# Patient Record
Sex: Male | Born: 1970
Health system: Southern US, Community
[De-identification: ages and names within clinical notes are randomized; demographics above are authoritative.]

## PROBLEM LIST (undated history)

## (undated) DIAGNOSIS — F419 Anxiety disorder, unspecified: Secondary | ICD-10-CM

## (undated) DIAGNOSIS — J449 Chronic obstructive pulmonary disease, unspecified: Secondary | ICD-10-CM

## (undated) DIAGNOSIS — K219 Gastro-esophageal reflux disease without esophagitis: Secondary | ICD-10-CM

## (undated) DIAGNOSIS — R0789 Other chest pain: Secondary | ICD-10-CM

## (undated) DIAGNOSIS — M545 Low back pain, unspecified: Secondary | ICD-10-CM

## (undated) DIAGNOSIS — R7309 Other abnormal glucose: Secondary | ICD-10-CM

## (undated) DIAGNOSIS — F329 Major depressive disorder, single episode, unspecified: Secondary | ICD-10-CM

## (undated) DIAGNOSIS — F32A Depression, unspecified: Secondary | ICD-10-CM

## (undated) DIAGNOSIS — R635 Abnormal weight gain: Secondary | ICD-10-CM

## (undated) HISTORY — DX: Low back pain: M54.5

## (undated) HISTORY — DX: Gastro-esophageal reflux disease without esophagitis: K21.9

## (undated) HISTORY — DX: Chronic obstructive pulmonary disease, unspecified: J44.9

## (undated) HISTORY — DX: Other abnormal glucose: R73.09

## (undated) HISTORY — DX: Depression, unspecified: F32.A

## (undated) HISTORY — DX: Anxiety disorder, unspecified: F41.9

## (undated) HISTORY — DX: Low back pain, unspecified: M54.50

## (undated) HISTORY — DX: Abnormal weight gain: R63.5

## (undated) HISTORY — DX: Major depressive disorder, single episode, unspecified: F32.9

## (undated) HISTORY — DX: Other chest pain: R07.89

---

## 2002-12-20 ENCOUNTER — Emergency Department (HOSPITAL_COMMUNITY): Admission: EM | Admit: 2002-12-20 | Discharge: 2002-12-21 | Payer: Self-pay | Admitting: Emergency Medicine

## 2003-08-21 ENCOUNTER — Emergency Department (HOSPITAL_COMMUNITY): Admission: EM | Admit: 2003-08-21 | Discharge: 2003-08-21 | Payer: Self-pay | Admitting: Emergency Medicine

## 2003-10-07 ENCOUNTER — Emergency Department (HOSPITAL_COMMUNITY): Admission: EM | Admit: 2003-10-07 | Discharge: 2003-10-07 | Payer: Self-pay | Admitting: Emergency Medicine

## 2003-11-04 ENCOUNTER — Emergency Department (HOSPITAL_COMMUNITY): Admission: EM | Admit: 2003-11-04 | Discharge: 2003-11-04 | Payer: Self-pay | Admitting: *Deleted

## 2003-12-19 ENCOUNTER — Emergency Department (HOSPITAL_COMMUNITY): Admission: EM | Admit: 2003-12-19 | Discharge: 2003-12-19 | Payer: Self-pay | Admitting: Emergency Medicine

## 2004-01-09 ENCOUNTER — Emergency Department (HOSPITAL_COMMUNITY): Admission: EM | Admit: 2004-01-09 | Discharge: 2004-01-09 | Payer: Self-pay | Admitting: Emergency Medicine

## 2004-02-01 ENCOUNTER — Emergency Department (HOSPITAL_COMMUNITY): Admission: EM | Admit: 2004-02-01 | Discharge: 2004-02-02 | Payer: Self-pay | Admitting: Emergency Medicine

## 2004-02-06 ENCOUNTER — Ambulatory Visit: Payer: Self-pay | Admitting: Internal Medicine

## 2004-03-06 ENCOUNTER — Emergency Department (HOSPITAL_COMMUNITY): Admission: EM | Admit: 2004-03-06 | Discharge: 2004-03-06 | Payer: Self-pay | Admitting: Emergency Medicine

## 2004-03-07 ENCOUNTER — Ambulatory Visit: Payer: Self-pay | Admitting: Internal Medicine

## 2004-03-20 ENCOUNTER — Ambulatory Visit: Payer: Self-pay | Admitting: Gastroenterology

## 2004-03-25 ENCOUNTER — Ambulatory Visit: Payer: Self-pay | Admitting: Gastroenterology

## 2004-04-15 ENCOUNTER — Ambulatory Visit: Payer: Self-pay | Admitting: Gastroenterology

## 2004-04-24 ENCOUNTER — Ambulatory Visit: Payer: Self-pay | Admitting: Internal Medicine

## 2004-08-25 ENCOUNTER — Ambulatory Visit: Payer: Self-pay | Admitting: Internal Medicine

## 2004-10-09 ENCOUNTER — Ambulatory Visit: Payer: Self-pay | Admitting: Gastroenterology

## 2004-11-17 ENCOUNTER — Ambulatory Visit: Payer: Self-pay | Admitting: Gastroenterology

## 2005-01-12 ENCOUNTER — Emergency Department (HOSPITAL_COMMUNITY): Admission: EM | Admit: 2005-01-12 | Discharge: 2005-01-12 | Payer: Self-pay | Admitting: Emergency Medicine

## 2005-01-22 ENCOUNTER — Ambulatory Visit: Payer: Self-pay | Admitting: Internal Medicine

## 2005-05-27 ENCOUNTER — Emergency Department (HOSPITAL_COMMUNITY): Admission: EM | Admit: 2005-05-27 | Discharge: 2005-05-27 | Payer: Self-pay | Admitting: Emergency Medicine

## 2005-06-08 ENCOUNTER — Ambulatory Visit: Payer: Self-pay | Admitting: Internal Medicine

## 2005-06-14 IMAGING — CR DG CHEST 2V
2 series · 2 of 2 positions shown · non-contrast
Comparison: none

CLINICAL DATA: Left lower chest pain and shortness of breath. 
 TWO VIEW CHEST ? 01/09/04:
 Comparing 12/19/03.

[view not recorded (1 of 2)]
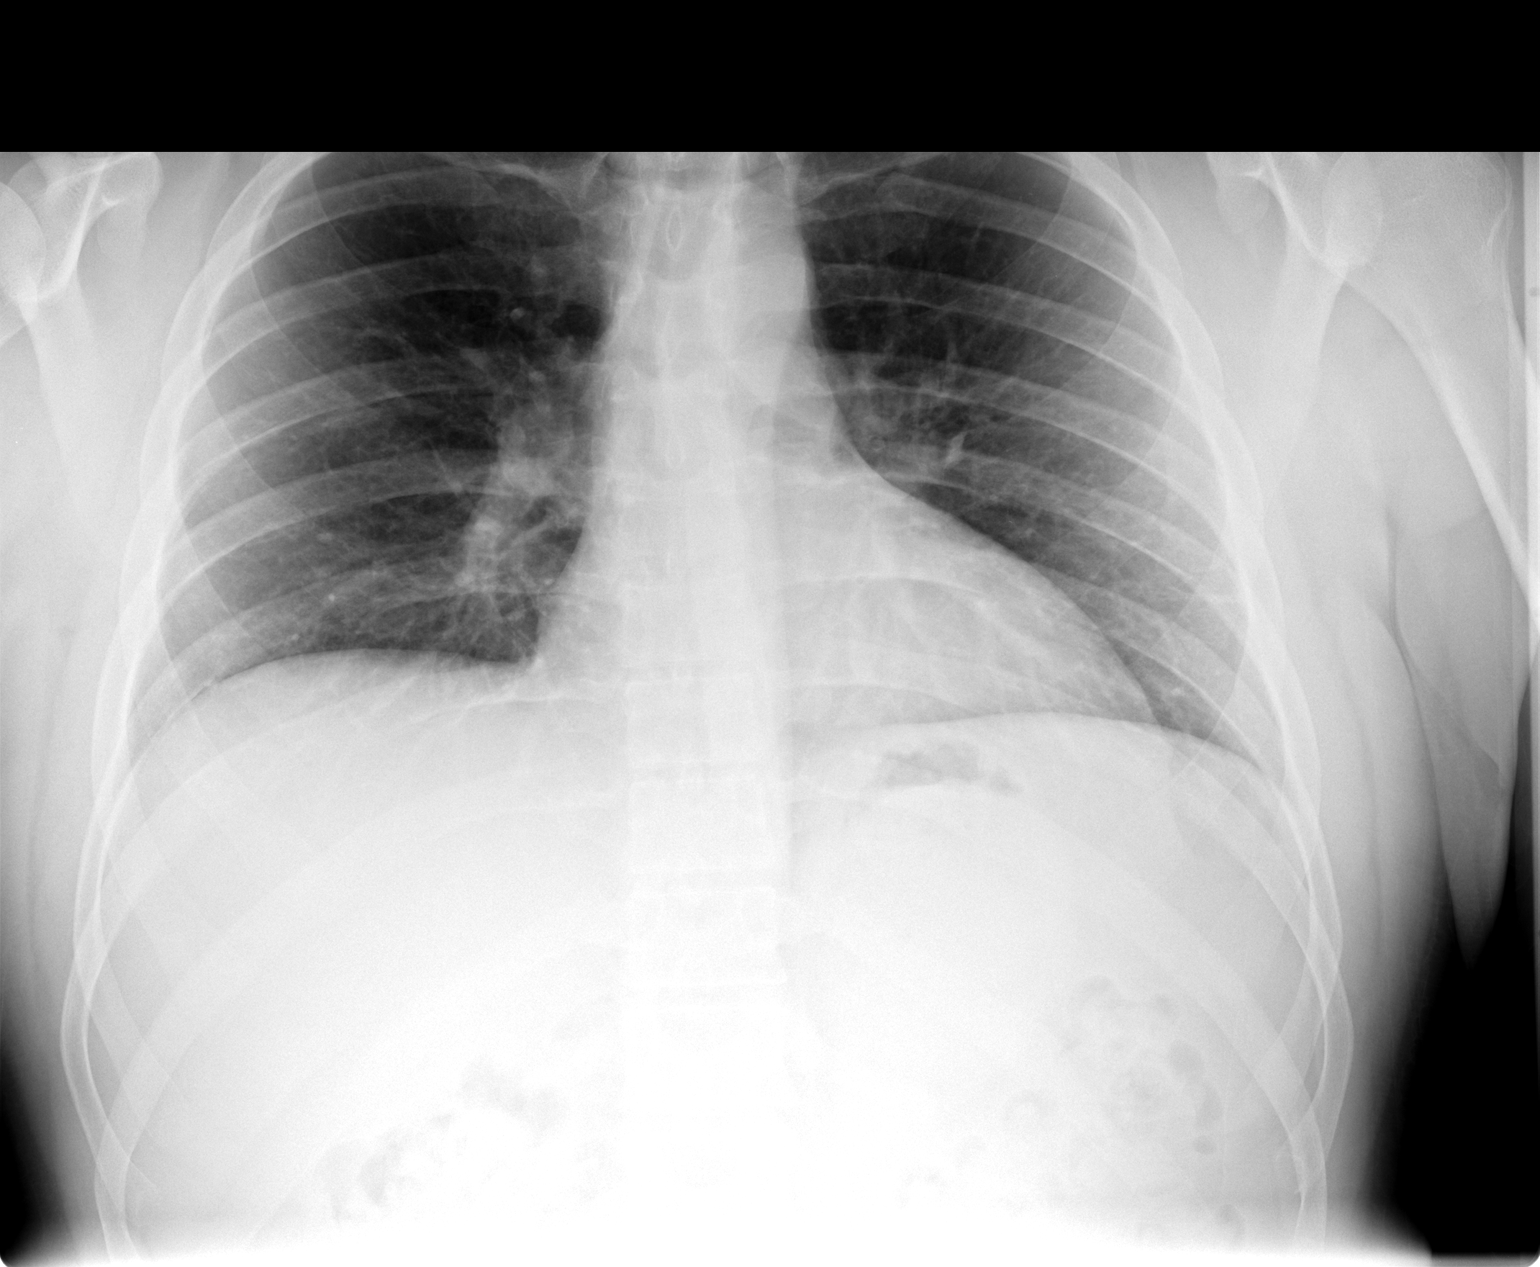

[view not recorded (2 of 2)]
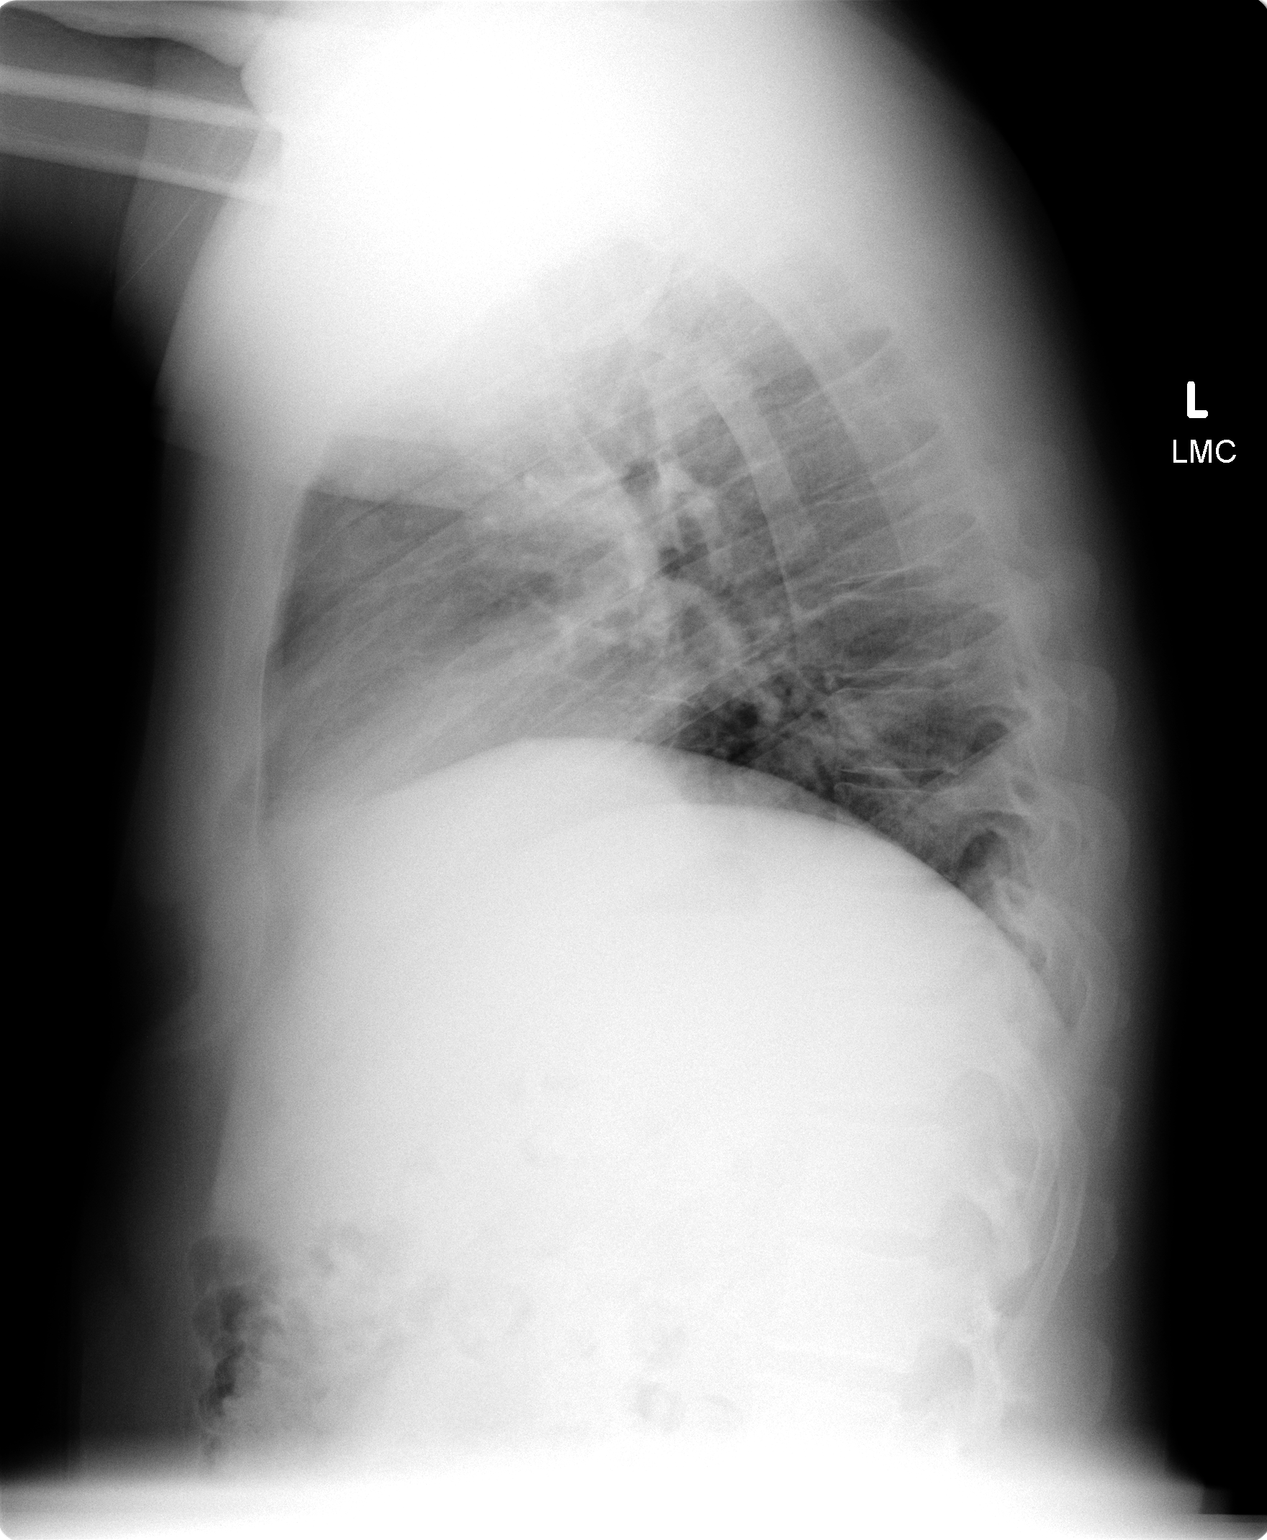

[2 of 2 positions shown; findings below may reference images not displayed]

FINDINGS: Minimal scarring at the left lung base is stable.   Low lung volumes are present.   The lungs appear otherwise clear.   Heart and mediastinum appear unremarkable.
IMPRESSION: 1.  Minimal scarring at the left lung base.

## 2005-06-29 ENCOUNTER — Ambulatory Visit: Payer: Self-pay | Admitting: Internal Medicine

## 2005-09-29 ENCOUNTER — Ambulatory Visit: Payer: Self-pay | Admitting: Internal Medicine

## 2006-01-01 ENCOUNTER — Ambulatory Visit: Payer: Self-pay | Admitting: Internal Medicine

## 2006-06-04 ENCOUNTER — Ambulatory Visit: Payer: Self-pay | Admitting: Internal Medicine

## 2006-06-04 LAB — CONVERTED CEMR LAB
ALT: 56 units/L — ABNORMAL HIGH (ref 0–40)
AST: 30 units/L (ref 0–37)
Albumin: 4 g/dL (ref 3.5–5.2)
Alkaline Phosphatase: 54 units/L (ref 39–117)
BUN: 9 mg/dL (ref 6–23)
Bilirubin, Direct: 0.1 mg/dL (ref 0.0–0.3)
CO2: 29 meq/L (ref 19–32)
Calcium: 9 mg/dL (ref 8.4–10.5)
Chloride: 107 meq/L (ref 96–112)
Creatinine, Ser: 0.9 mg/dL (ref 0.4–1.5)
GFR calc Af Amer: 125 mL/min
GFR calc non Af Amer: 103 mL/min
Glucose, Bld: 105 mg/dL — ABNORMAL HIGH (ref 70–99)
Hgb A1c MFr Bld: 5.9 % (ref 4.6–6.0)
Potassium: 4.3 meq/L (ref 3.5–5.1)
Sodium: 141 meq/L (ref 135–145)
TSH: 1.21 microintl units/mL (ref 0.35–5.50)
Total Bilirubin: 0.7 mg/dL (ref 0.3–1.2)
Total Protein: 7.1 g/dL (ref 6.0–8.3)

## 2006-10-02 DIAGNOSIS — K219 Gastro-esophageal reflux disease without esophagitis: Secondary | ICD-10-CM

## 2006-10-26 ENCOUNTER — Ambulatory Visit: Payer: Self-pay | Admitting: Internal Medicine

## 2007-01-25 ENCOUNTER — Telehealth: Payer: Self-pay | Admitting: Internal Medicine

## 2007-01-27 ENCOUNTER — Telehealth (INDEPENDENT_AMBULATORY_CARE_PROVIDER_SITE_OTHER): Payer: Self-pay | Admitting: *Deleted

## 2007-02-02 ENCOUNTER — Telehealth: Payer: Self-pay | Admitting: Internal Medicine

## 2007-02-07 ENCOUNTER — Ambulatory Visit: Payer: Self-pay | Admitting: Internal Medicine

## 2007-02-07 DIAGNOSIS — F411 Generalized anxiety disorder: Secondary | ICD-10-CM | POA: Insufficient documentation

## 2007-02-07 DIAGNOSIS — J449 Chronic obstructive pulmonary disease, unspecified: Secondary | ICD-10-CM

## 2007-02-07 DIAGNOSIS — F17200 Nicotine dependence, unspecified, uncomplicated: Secondary | ICD-10-CM | POA: Insufficient documentation

## 2007-02-07 DIAGNOSIS — R635 Abnormal weight gain: Secondary | ICD-10-CM | POA: Insufficient documentation

## 2007-02-07 DIAGNOSIS — J4489 Other specified chronic obstructive pulmonary disease: Secondary | ICD-10-CM | POA: Insufficient documentation

## 2007-02-07 DIAGNOSIS — J209 Acute bronchitis, unspecified: Secondary | ICD-10-CM

## 2007-05-19 ENCOUNTER — Encounter: Payer: Self-pay | Admitting: Internal Medicine

## 2007-05-20 ENCOUNTER — Ambulatory Visit: Payer: Self-pay | Admitting: Internal Medicine

## 2007-05-20 DIAGNOSIS — M25569 Pain in unspecified knee: Secondary | ICD-10-CM | POA: Insufficient documentation

## 2007-05-20 DIAGNOSIS — R7309 Other abnormal glucose: Secondary | ICD-10-CM | POA: Insufficient documentation

## 2007-05-30 ENCOUNTER — Telehealth (INDEPENDENT_AMBULATORY_CARE_PROVIDER_SITE_OTHER): Payer: Self-pay | Admitting: *Deleted

## 2007-07-20 ENCOUNTER — Ambulatory Visit: Payer: Self-pay | Admitting: Internal Medicine

## 2007-07-20 DIAGNOSIS — M79609 Pain in unspecified limb: Secondary | ICD-10-CM | POA: Insufficient documentation

## 2007-10-10 ENCOUNTER — Telehealth: Payer: Self-pay | Admitting: Internal Medicine

## 2007-10-31 ENCOUNTER — Telehealth: Payer: Self-pay | Admitting: Internal Medicine

## 2007-11-21 ENCOUNTER — Telehealth: Payer: Self-pay | Admitting: Internal Medicine

## 2007-11-21 ENCOUNTER — Ambulatory Visit: Payer: Self-pay | Admitting: Internal Medicine

## 2008-02-20 ENCOUNTER — Telehealth: Payer: Self-pay | Admitting: Internal Medicine

## 2008-03-15 ENCOUNTER — Telehealth: Payer: Self-pay | Admitting: Internal Medicine

## 2008-04-03 ENCOUNTER — Ambulatory Visit: Payer: Self-pay | Admitting: Internal Medicine

## 2008-04-03 LAB — CONVERTED CEMR LAB: Blood Glucose, Fingerstick: 151

## 2008-04-17 ENCOUNTER — Telehealth: Payer: Self-pay | Admitting: Internal Medicine

## 2008-05-16 ENCOUNTER — Encounter (INDEPENDENT_AMBULATORY_CARE_PROVIDER_SITE_OTHER): Payer: Self-pay | Admitting: *Deleted

## 2008-05-24 ENCOUNTER — Ambulatory Visit: Payer: Self-pay | Admitting: Internal Medicine

## 2008-05-24 DIAGNOSIS — M545 Low back pain: Secondary | ICD-10-CM | POA: Insufficient documentation

## 2008-06-06 ENCOUNTER — Telehealth: Payer: Self-pay | Admitting: Internal Medicine

## 2008-06-18 ENCOUNTER — Encounter: Payer: Self-pay | Admitting: Internal Medicine

## 2008-08-08 ENCOUNTER — Telehealth: Payer: Self-pay | Admitting: Internal Medicine

## 2008-09-04 ENCOUNTER — Telehealth: Payer: Self-pay | Admitting: Internal Medicine

## 2008-09-26 ENCOUNTER — Ambulatory Visit: Payer: Self-pay | Admitting: Internal Medicine

## 2008-09-26 DIAGNOSIS — E669 Obesity, unspecified: Secondary | ICD-10-CM

## 2008-09-26 DIAGNOSIS — R079 Chest pain, unspecified: Secondary | ICD-10-CM | POA: Insufficient documentation

## 2008-11-02 ENCOUNTER — Ambulatory Visit: Payer: Self-pay | Admitting: Internal Medicine

## 2008-12-27 ENCOUNTER — Telehealth: Payer: Self-pay | Admitting: Internal Medicine

## 2009-01-28 ENCOUNTER — Telehealth: Payer: Self-pay | Admitting: Internal Medicine

## 2009-02-15 ENCOUNTER — Ambulatory Visit: Payer: Self-pay | Admitting: Internal Medicine

## 2009-05-06 ENCOUNTER — Telehealth: Payer: Self-pay | Admitting: Internal Medicine

## 2009-05-08 ENCOUNTER — Telehealth: Payer: Self-pay | Admitting: Internal Medicine

## 2009-05-30 ENCOUNTER — Telehealth: Payer: Self-pay | Admitting: Internal Medicine

## 2009-05-30 ENCOUNTER — Telehealth (INDEPENDENT_AMBULATORY_CARE_PROVIDER_SITE_OTHER): Payer: Self-pay | Admitting: *Deleted

## 2009-06-05 ENCOUNTER — Ambulatory Visit: Payer: Self-pay | Admitting: Internal Medicine

## 2009-08-27 ENCOUNTER — Ambulatory Visit: Payer: Self-pay | Admitting: Internal Medicine

## 2009-11-07 ENCOUNTER — Ambulatory Visit: Payer: Self-pay | Admitting: Internal Medicine

## 2009-11-07 DIAGNOSIS — F329 Major depressive disorder, single episode, unspecified: Secondary | ICD-10-CM

## 2009-11-26 ENCOUNTER — Ambulatory Visit: Payer: Self-pay | Admitting: Internal Medicine

## 2009-12-03 ENCOUNTER — Telehealth: Payer: Self-pay | Admitting: Internal Medicine

## 2010-02-03 ENCOUNTER — Telehealth: Payer: Self-pay | Admitting: Internal Medicine

## 2010-02-05 ENCOUNTER — Ambulatory Visit: Payer: Self-pay | Admitting: Internal Medicine

## 2010-02-17 ENCOUNTER — Telehealth: Payer: Self-pay | Admitting: Internal Medicine

## 2010-03-25 ENCOUNTER — Telehealth: Payer: Self-pay | Admitting: Internal Medicine

## 2010-04-01 ENCOUNTER — Telehealth: Payer: Self-pay | Admitting: Internal Medicine

## 2010-04-10 NOTE — Assessment & Plan Note (Signed)
Summary: COLD/NWS   Vital Signs:  Patient profile:   40 year old male Height:      68 inches Weight:      168 pounds BMI:     25.64 Temp:     97.0 degrees F oral Pulse rate:   88 / minute Pulse rhythm:   regular Resp:     16 per minute BP sitting:   128 / 70  (left arm) Cuff size:   regular  Vitals Entered By: Lanier Prude, CMA(AAMA) (November 07, 2009 9:15 AM) CC: runny nose/sneezing Is Patient Diabetic? No Comments pt is not taking Prozac, Alprazolam, Tretinoin, Triamcinolone, or Vimovo   CC:  runny nose/sneezing.  History of Present Illness: The patient presents for a follow up of back pain, anxiety, depression and LBP. C/o URI - cough x 1 wk   Preventive Screening-Counseling & Management  Caffeine-Diet-Exercise     Does Patient Exercise: no  Current Medications (verified): 1)  Prozac 20 Mg  Caps (Fluoxetine Hcl) .... By Mouth Once Daily 2)  Vitamin D3 1000 Unit  Tabs (Cholecalciferol) .Marland Kitchen.. 1 Qd 3)  Alprazolam 2 Mg  Tabs (Alprazolam) .Marland Kitchen.. 1 By Mouth Three Times A Day As Needed Anxiety 4)  Phentermine Hcl 37.5 Mg Tabs (Phentermine Hcl) .Marland Kitchen.. 1 By Mouth Qd 5)  Hydrocodone-Acetaminophen 10-325 Mg Tabs (Hydrocodone-Acetaminophen) .Marland Kitchen.. 1 By Mouth Two Times A Day By Mouth Two Times A Day. Ok To [place] Today. 6)  Tretinoin 0.025 % Crea (Tretinoin) .... Use Qhs 7)  Triamcinolone Acetonide 0.5 % Crea (Triamcinolone Acetonide) .... Use Two Times A Day As Needed On Fingers 8)  Vimovo 500-20 Mg Tbec (Naproxen-Esomeprazole) .Marland Kitchen.. 1 By Mouth Once Daily - Two Times A Day Pc As Needed Pain  Allergies (verified): 1)  Naprosyn 2)  Darvocet 3)  Hydrocodone-Acetaminophen (Hydrocodone-Acetaminophen)  Past History:  Family History: Last updated: 02/07/2007 Family History of Anxiety  Family History Diabetes 1st degree relative Family History of CAD Male 1st degree relative <50  Social History: Last updated: 11/07/2009 Occupation: Nutritional therapist Current Smoker Married 1 dtr Alcohol  use-no Regular exercise-no  Past Medical History: GERD Anxiety COPD Chest pain, non cardiac Elev. glucose Wt gain Low back pain Depression [facility] lISA POULOS, NP  Past Surgical History: Denies surgical history  Social History: Occupation: Nutritional therapist Current Smoker Married 1 dtr Alcohol use-no Regular exercise-no Does Patient Exercise:  no  Review of Systems  The patient denies fever, dyspnea on exertion, abdominal pain, and difficulty walking.    Physical Exam  General:  NAD well-developed and well-nourished.   Nose:  External nasal examination shows no deformity or inflammation. Nasal mucosa are pink and moist without lesions or exudates. Mouth:  Oral mucosa and oropharynx without lesions or exudates.  Teeth in good repair. Lungs:  Normal respiratory effort, chest expands symmetrically. Lungs are clear to auscultation, no crackles or wheezes. Heart:  Normal rate and regular rhythm. S1 and S2 normal without gallop, murmur, click, rub or other extra sounds. Abdomen:  Bowel sounds positive,abdomen soft and non-tender without masses, organomegaly or hernias noted. Msk:  Lumbar-sacral spine is tender to palpation over paraspinal muscles and painfull with the ROM  Neurologic:  No cranial nerve deficits noted. Station and gait are normal. Plantar reflexes are down-going bilaterally. DTRs are symmetrical throughout. Sensory, motor and coordinative functions appear intact. Skin:  Clear w/mild aging changes Psych:  Oriented X3.  not depressed appearing.     Impression & Recommendations:  Problem # 1:  DEPRESSION (ICD-311) Assessment  Improved  His updated medication list for this problem includes:    Prozac 20 Mg Caps (Fluoxetine hcl) ..... By mouth once daily    Alprazolam 2 Mg Tabs (Alprazolam) .Marland Kitchen... 1 by mouth three times a day as needed anxiety  Problem # 2:  OBESITY (ICD-278.00) Assessment: Unchanged Doing well w/help of occasional Phentermine  Problem # 3:   LOW BACK PAIN (ICD-724.2) Assessment: Unchanged  His updated medication list for this problem includes:    Hydrocodone-acetaminophen 10-325 Mg Tabs (Hydrocodone-acetaminophen) .Marland Kitchen... 1 by mouth two times a day by mouth two times a day. ok to fill today.    Vimovo 500-20 Mg Tbec (Naproxen-esomeprazole) .Marland Kitchen... 1 by mouth once daily - two times a day pc as needed pain  Problem # 4:  BRONCHITIS, ACUTE (ICD-466.0) Assessment: New  His updated medication list for this problem includes:    Zithromax Z-pak 250 Mg Tabs (Azithromycin) .Marland Kitchen... As dirrected  Problem # 5:  TOBACCO USE DISORDER/SMOKER-SMOKING CESSATION DISCUSSED (ICD-305.1) Assessment: Unchanged  Encouraged smoking cessation and discussed different methods for smoking cessation.   Complete Medication List: 1)  Prozac 20 Mg Caps (Fluoxetine hcl) .... By mouth once daily 2)  Vitamin D3 1000 Unit Tabs (Cholecalciferol) .Marland Kitchen.. 1 qd 3)  Alprazolam 2 Mg Tabs (Alprazolam) .Marland Kitchen.. 1 by mouth three times a day as needed anxiety 4)  Phentermine Hcl 37.5 Mg Tabs (Phentermine hcl) .Marland Kitchen.. 1 by mouth qd 5)  Hydrocodone-acetaminophen 10-325 Mg Tabs (Hydrocodone-acetaminophen) .Marland Kitchen.. 1 by mouth two times a day by mouth two times a day. ok to fill today. 6)  Tretinoin 0.025 % Crea (Tretinoin) .... Use qhs 7)  Triamcinolone Acetonide 0.5 % Crea (Triamcinolone acetonide) .... Use two times a day as needed on fingers 8)  Vimovo 500-20 Mg Tbec (Naproxen-esomeprazole) .Marland Kitchen.. 1 by mouth once daily - two times a day pc as needed pain 9)  Zithromax Z-pak 250 Mg Tabs (Azithromycin) .... As dirrected   Patient Instructions: 1)  Please schedule a follow-up appointment in 4 months. Prescriptions: ZITHROMAX Z-PAK 250 MG TABS (AZITHROMYCIN) as dirrected  #1 x 0   Entered and Authorized by:   Tresa Garter MD   Signed by:   Tresa Garter MD on 11/07/2009   Method used:   Print then Give to Patient   RxID:   [id number] HYDROCODONE-ACETAMINOPHEN 10-325 MG  TABS (HYDROCODONE-ACETAMINOPHEN) 1 by mouth two times a day by mouth two times a day. OK to fill today.  #60 x 2   Entered and Authorized by:   Tresa Garter MD   Signed by:   Tresa Garter MD on 11/07/2009   Method used:   Print then Give to Patient   RxID:   [id number]

## 2010-04-10 NOTE — Progress Notes (Signed)
Summary: Hydrocodone  Phone Note Call from Patient Call back at [phone]   Summary of Call: Pt left vm - He states 2 office visits ago MD changed hydrocodone from 2 daily to 3 daily. I see 2 rx's ago qty was 90 but sig was not changed. Ok for three times a day?  Initial call taken by: Lamar Sprinkles, CMA,  February 17, 2010 1:57 PM  Follow-up for Phone Call        ok #90 Thank you!  Follow-up by: Tresa Garter MD,  February 17, 2010 5:46 PM  Additional Follow-up for Phone Call Additional follow up Details #1::        Left vm on pt's cell # to check w/pharm. Called in w/correct directions Additional Follow-up by: Lamar Sprinkles, CMA,  February 18, 2010 3:54 PM    New/Updated Medications: HYDROCODONE-ACETAMINOPHEN 10-325 MG TABS (HYDROCODONE-ACETAMINOPHEN) 1 by mouth two times a day by mouth two times a day fill 02/23/2010 HYDROCODONE-ACETAMINOPHEN 10-325 MG TABS (HYDROCODONE-ACETAMINOPHEN) 1 by mouth three times daily ok to fill 02/23/2010 Prescriptions: HYDROCODONE-ACETAMINOPHEN 10-325 MG TABS (HYDROCODONE-ACETAMINOPHEN) 1 by mouth three times daily ok to fill 02/23/2010  #90 x 0   Entered by:   Lamar Sprinkles, CMA   Authorized by:   Tresa Garter MD   Signed by:   Lamar Sprinkles, CMA on 02/18/2010   Method used:   [facility] to ...       [facility]  [address] [id number]       [address]       [place], [place]  [address]       Ph: [phone] or [phone]       Fax: [phone]   RxID:   [id number] HYDROCODONE-ACETAMINOPHEN 10-325 MG TABS (HYDROCODONE-ACETAMINOPHEN) 1 by mouth two times a day by mouth two times a day fill 02/23/2010  #90 x 0   Entered by:   Lamar Sprinkles, CMA   Authorized by:   Tresa Garter MD   Signed by:   Lamar Sprinkles, CMA on 02/18/2010   Method used:   Telephoned to ...       [facility]  [address] [id number]       [address]       [place], [place]  [address]       Ph: [phone]  or [phone]       Fax: [phone]   RxID:   [id number]

## 2010-04-10 NOTE — Progress Notes (Signed)
Summary: REFILL  Phone Note Refill Request   Refills Requested: Medication #1:  HYDROCODONE-ACETAMINOPHEN 10-325 MG TABS 1 by mouth two times a day by mouth bid Initial call taken by: Lamar Sprinkles, CMA,  May 06, 2009 8:59 AM  Follow-up for Phone Call        ok x1 Follow-up by: Tresa Garter MD,  May 06, 2009 4:53 PM    Prescriptions: HYDROCODONE-ACETAMINOPHEN 10-325 MG TABS (HYDROCODONE-ACETAMINOPHEN) 1 by mouth two times a day by mouth bid  #60 x 1   Entered by:   Lamar Sprinkles, CMA   Authorized by:   Tresa Garter MD   Signed by:   Lamar Sprinkles, CMA on 05/07/2009   Method used:   [facility]phoned to ...       [facility]  [address] [id number]       [address]       [place], [place]  [address]       Ph: [phone] or [phone]       Fax: [phone]   RxID:   [id number]

## 2010-04-10 NOTE — Assessment & Plan Note (Signed)
Summary: BACK PAIN /NWS  #   Vital Signs:  Patient profile:   40 year old male Height:      68 inches Weight:      173 pounds BMI:     26.40 Temp:     97.1 degrees F oral Pulse rate:   78 / minute Pulse rhythm:   regular Resp:     16 per minute BP sitting:   128 / 70  (left arm) Cuff size:   regular  Vitals Entered By: Lanier Prude, Beverly Gust) (November 26, 2009 4:19 PM) CC: pain meds not helping as much Is Patient Diabetic? No   CC:  pain meds not helping as much.  History of Present Illness: C/o more LBP C/o anxiety and depression  Preventive Screening-Counseling & Management  Alcohol-Tobacco     Smoking Status: current     Smoking Cessation Counseling: yes     Packs/Day: 1.0  Current Medications (verified): 1)  Prozac 20 Mg  Caps (Fluoxetine Hcl) .... By Mouth Once Daily 2)  Vitamin D3 1000 Unit  Tabs (Cholecalciferol) .Marland Kitchen.. 1 Qd 3)  Alprazolam 2 Mg  Tabs (Alprazolam) .Marland Kitchen.. 1 By Mouth Three Times A Day As Needed Anxiety 4)  Phentermine Hcl 37.5 Mg Tabs (Phentermine Hcl) .Marland Kitchen.. 1 By Mouth Qd 5)  Hydrocodone-Acetaminophen 10-325 Mg Tabs (Hydrocodone-Acetaminophen) .Marland Kitchen.. 1 By Mouth Two Times A Day By Mouth Two Times A Day. Ok To [place] Today. 6)  Tretinoin 0.025 % Crea (Tretinoin) .... Use Qhs 7)  Triamcinolone Acetonide 0.5 % Crea (Triamcinolone Acetonide) .... Use Two Times A Day As Needed On Fingers 8)  Vimovo 500-20 Mg Tbec (Naproxen-Esomeprazole) .Marland Kitchen.. 1 By Mouth Once Daily - Two Times A Day Pc As Needed Pain  Allergies (verified): 1)  Naprosyn 2)  Darvocet 3)  Hydrocodone-Acetaminophen (Hydrocodone-Acetaminophen)  Past History:  Past Medical History: Last updated: 11/07/2009 GERD Anxiety COPD Chest pain, non cardiac Elev. glucose Wt gain Low back pain Depression [facility] lISA POULOS, NP  Social History: Packs/Day:  1.0  Review of Systems       LBP, stiff  Physical Exam  General:  NAD well-developed and well-nourished.   Nose:  External  nasal examination shows no deformity or inflammation. Nasal mucosa are pink and moist without lesions or exudates. Mouth:  Oral mucosa and oropharynx without lesions or exudates.  Teeth in good repair. Lungs:  Normal respiratory effort, chest expands symmetrically. Lungs are clear to auscultation, no crackles or wheezes. Heart:  Normal rate and regular rhythm. S1 and S2 normal without gallop, murmur, click, rub or other extra sounds. Msk:  Lumbar-sacral spine is tender to palpation over paraspinal muscles and painfull with the ROM  Neurologic:  No cranial nerve deficits noted. Station and gait are normal. Plantar reflexes are down-going bilaterally. DTRs are symmetrical throughout. Sensory, motor and coordinative functions appear intact. Skin:  Clear w/mild aging changes Psych:  Oriented X3, normally interactive, not suicidal, not homicidal, and depressed affect.     Impression & Recommendations:  Problem # 1:  LOW BACK PAIN (ICD-724.2) Assessment Deteriorated Improve compl w/Vit D, Vimovo His updated medication list for this problem includes:    Hydrocodone-acetaminophen 10-325 Mg Tabs (Hydrocodone-acetaminophen) .Marland Kitchen... 1 by mouth two times a day by mouth two times a day.    Vimovo 500-20 Mg Tbec (Naproxen-esomeprazole) .Marland Kitchen... 1 by mouth once daily - two times a day pc as needed pain  Problem # 2:  OBESITY (ICD-278.00) Assessment: Deteriorated On diet  Problem # 3:  ANXIETY (  ICD-300.00) Assessment: Deteriorated  His updated medication list for this problem includes:    Prozac 20 Mg Caps (Fluoxetine hcl) ..... By mouth once daily    Alprazolam 2 Mg Tabs (Alprazolam) .Marland Kitchen... 1 by mouth three times a day as needed anxiety  Complete Medication List: 1)  Prozac 20 Mg Caps (Fluoxetine hcl) .... By mouth once daily 2)  Vitamin D3 1000 Unit Tabs (Cholecalciferol) .Marland Kitchen.. 1 qd 3)  Alprazolam 2 Mg Tabs (Alprazolam) .Marland Kitchen.. 1 by mouth three times a day as needed anxiety 4)  Phentermine Hcl 37.5 Mg Tabs  (Phentermine hcl) .Marland Kitchen.. 1 by mouth qd 5)  Hydrocodone-acetaminophen 10-325 Mg Tabs (Hydrocodone-acetaminophen) .Marland Kitchen.. 1 by mouth two times a day by mouth two times a day. 6)  Tretinoin 0.025 % Crea (Tretinoin) .... Use qhs 7)  Triamcinolone Acetonide 0.5 % Crea (Triamcinolone acetonide) .... Use two times a day as needed on fingers 8)  Vimovo 500-20 Mg Tbec (Naproxen-esomeprazole) .Marland Kitchen.. 1 by mouth once daily - two times a day pc as needed pain  Patient Instructions: 1)  Keep return office visit  2)  Go on Youtube (www.youtube.com) and look up "piriformis stretch", "Ileopsoas stretch", "IT band stretch" and "gluteus stretch". See the anatomy and learn the symptoms.  You can try to self-diagnose.Do the stretches - it may help!     Prescriptions: PROZAC 20 MG  CAPS (FLUOXETINE HCL) by mouth once daily  #30 x 6   Entered and Authorized by:   Tresa Garter MD   Signed by:   Tresa Garter MD on 11/26/2009   Method used:   Print then Give to Patient   RxID:   [id number] HYDROCODONE-ACETAMINOPHEN 10-325 MG TABS (HYDROCODONE-ACETAMINOPHEN) 1 by mouth two times a day by mouth two times a day.  #90 x 2   Entered and Authorized by:   Tresa Garter MD   Signed by:   Tresa Garter MD on 11/26/2009   Method used:   Print then Give to Patient   RxID:   [id number]

## 2010-04-10 NOTE — Assessment & Plan Note (Signed)
Summary: DISCUSS MEDS PER TRIAGE/NWS   Vital Signs:  Patient profile:   40 year old male Weight:      170 pounds Temp:     97.4 degrees F oral Pulse rate:   71 / minute BP sitting:   94 / 54  (left arm)  Vitals Entered By: Tora Perches (June 05, 2009 11:18 AM) CC: f/u on meds Is Patient Diabetic? No   CC:  f/u on meds.  History of Present Illness: The patient presents for a follow up of LBP, obesity and depression. Ran out early of Hydrocodone.   Preventive Screening-Counseling & Management  Alcohol-Tobacco     Smoking Status: current  Current Medications (verified): 1)  Prozac 20 Mg  Caps (Fluoxetine Hcl) .... By Mouth Once Daily 2)  Vitamin D3 1000 Unit  Tabs (Cholecalciferol) .Marland Kitchen.. 1 Qd 3)  Alprazolam 2 Mg  Tabs (Alprazolam) .Marland Kitchen.. 1 By Mouth Three Times A Day As Needed Anxiety 4)  Phentermine Hcl 37.5 Mg Tabs (Phentermine Hcl) .Marland Kitchen.. 1 By Mouth Qd 5)  Hydrocodone-Acetaminophen 10-325 Mg Tabs (Hydrocodone-Acetaminophen) .Marland Kitchen.. 1 By Mouth Two Times A Day By Mouth Bid 6)  Tretinoin 0.025 % Crea (Tretinoin) .... Use Qhs 7)  Triamcinolone Acetonide 0.5 % Crea (Triamcinolone Acetonide) .... Use Two Times A Day As Needed On Fingers  Allergies: 1)  Naprosyn 2)  Darvocet 3)  Hydrocodone-Acetaminophen (Hydrocodone-Acetaminophen)  Past History:  Past Medical History: Last updated: 05/24/2008 GERD Anxiety COPD Chest pain, non cardiac Elev. glucose Wt gain Low back pain  Social History: Last updated: 02/07/2007 Occupation: plumber Current Smoker  Physical Exam  General:  NAD well-developed and well-nourished.   Mouth:  Oral mucosa and oropharynx without lesions or exudates.  Teeth in good repair. Lungs:  Normal respiratory effort, chest expands symmetrically. Lungs are clear to auscultation, no crackles or wheezes. Heart:  Normal rate and regular rhythm. S1 and S2 normal without gallop, murmur, click, rub or other extra sounds. Msk:  Lumbar-sacral spine is tender to  palpation over paraspinal muscles and painfull with the ROM    Impression & Recommendations:  Problem # 1:  LOW BACK PAIN (ICD-724.2) Assessment Unchanged  His updated medication list for this problem includes:    Hydrocodone-acetaminophen 10-325 Mg Tabs (Hydrocodone-acetaminophen) .Marland Kitchen... 1 by mouth two times a day by mouth two times a day ok to fill early  Problem # 2:  OBESITY (ICD-278.00) Assessment: Improved Phentermine as needed   Problem # 3:  ANXIETY (ICD-300.00) Assessment: Unchanged  His updated medication list for this problem includes:    Prozac 20 Mg Caps (Fluoxetine hcl) ..... By mouth once daily    Alprazolam 2 Mg Tabs (Alprazolam) .Marland Kitchen... 1 by mouth three times a day as needed anxiety  Complete Medication List: 1)  Prozac 20 Mg Caps (Fluoxetine hcl) .... By mouth once daily 2)  Vitamin D3 1000 Unit Tabs (Cholecalciferol) .Marland Kitchen.. 1 qd 3)  Alprazolam 2 Mg Tabs (Alprazolam) .Marland Kitchen.. 1 by mouth three times a day as needed anxiety 4)  Phentermine Hcl 37.5 Mg Tabs (Phentermine hcl) .Marland Kitchen.. 1 by mouth qd 5)  Hydrocodone-acetaminophen 10-325 Mg Tabs (Hydrocodone-acetaminophen) .Marland Kitchen.. 1 by mouth two times a day by mouth two times a day. ok to fill today. 6)  Tretinoin 0.025 % Crea (Tretinoin) .... Use qhs 7)  Triamcinolone Acetonide 0.5 % Crea (Triamcinolone acetonide) .... Use two times a day as needed on fingers  Patient Instructions: 1)  Please schedule a follow-up appointment in 3 months. 2)  Try to eat  more raw plant food, fresh and dry fruit, raw almonds, leafy vegetables, whole foods and less red meat, less animal fat. Poultry and fish is better for you than pork and beef. Avoid processed foods (canned soups, hot dogs, sausage, bacon , frozen dinners). Avoid corn syrup, high fructose syrup or aspartam and Splenda  containing drinks. Honey, Agave and Stevia are better sweeteners. Make your own  dressing with olive oil, wine vinegar, lemon juce, garlic etc. for your salads. 3)  Use  stretching exercises that I have provided (15 min. or longer every day)  Prescriptions: PROZAC 20 MG  CAPS (FLUOXETINE HCL) by mouth once daily  #90 x 1   Entered and Authorized by:   Tresa Garter MD   Signed by:   Tresa Garter MD on 06/05/2009   Method used:   Print then Give to Patient   RxID:   [id number] HYDROCODONE-ACETAMINOPHEN 10-325 MG TABS (HYDROCODONE-ACETAMINOPHEN) 1 by mouth two times a day by mouth two times a day. OK to fill today.  #60 x 2   Entered and Authorized by:   Tresa Garter MD   Signed by:   Tresa Garter MD on 06/05/2009   Method used:   Print then Give to Patient   RxID:   [id number] PHENTERMINE HCL 37.5 MG TABS (PHENTERMINE HCL) 1 by mouth qd  #30 x 2   Entered and Authorized by:   Tresa Garter MD   Signed by:   Tresa Garter MD on 06/05/2009   Method used:   Print then Give to Patient   RxID:   [id number]

## 2010-04-10 NOTE — Progress Notes (Signed)
Summary: Pharmacy correction  Phone Note Call from Patient Call back at Home Phone [phone]   Summary of Call: Patient called in regards to pain med. [facility] states that we have not called them. [facility] was made aware [facility] and [facility]. [facility] and [facility]. Initial call taken by: Lucious Groves,  May 08, 2009 11:01 AM    Prescriptions: HYDROCODONE-ACETAMINOPHEN 10-325 MG TABS (HYDROCODONE-ACETAMINOPHEN) 1 by mouth two times a day by mouth bid  #60 x 1   Entered by:   Lucious Groves   Authorized by:   Tresa Garter MD   Signed by:   Lucious Groves on 05/08/2009   Method used:   Telephoned to ...       [facility]       [address]       [place], [place]  [address]       Ph: [phone]       Fax: [phone]   RxID:   [id number]

## 2010-04-10 NOTE — Progress Notes (Signed)
Summary: REFILL  Phone Note Other Incoming   Summary of Call: See previous phone note, Pt is not due for refill. Last refill of Hydrocodone was 3/02 # 60 at [facility] on [address] [facility] says that pt tried to fill early. Gave confirmation to pharm not to fill med until 06/08/2009. Called [facility] and informed them that pt has refill at [facility] and is trying to fill early. Denied refill request at [facility].  Initial call taken by: Lamar Sprinkles, CMA,  May 30, 2009 5:05 PM  Follow-up for Phone Call        Patient left message on triage again requesting refill, I notified patient of the above and patient made me aware that he is out of this med b/c his spouse took some of his med. Per the patient he did not "let" his wife take them, she did it on her own. I made patient aware that we cannot fill this med and transferred to scheduling so patient can make appt to discuss. Follow-up by: Lucious Groves,  May 31, 2009 9:36 AM  Additional Follow-up for Phone Call Additional follow up Details #1::        Noted. Thx Additional Follow-up by: Tresa Garter MD,  May 31, 2009 5:19 PM

## 2010-04-10 NOTE — Assessment & Plan Note (Signed)
Summary: f/u appt/#/cd   Vital Signs:  Patient profile:   40 year old male Height:      68 inches Weight:      172 pounds BMI:     26.25 Temp:     99.1 degrees F oral Pulse rate:   88 / minute Pulse rhythm:   regular Resp:     16 per minute BP sitting:   110 / 68  (left arm) Cuff size:   regular  Vitals Entered By: Lanier Prude, CMA(AAMA) (February 05, 2010 9:58 AM) CC: fingers spliting/discuss meds Is Patient Diabetic? No Comments pt need rf on Triamcinolon cream and Hydrocodone. He is not taking Alprazolam   CC:  fingers spliting/discuss meds.  History of Present Illness: The patient presents for a follow up of back pain, anxiety, depression. The patient presents with complaints of sore throat, fever, cough, sinus congestion and drainge of  2 days duration. He left his Vicodin at his brother's and is unable to get it back...  Current Medications (verified): 1)  Prozac 20 Mg  Caps (Fluoxetine Hcl) .... By Mouth Once Daily 2)  Vitamin D3 1000 Unit  Tabs (Cholecalciferol) .Marland Kitchen.. 1 Qd 3)  Alprazolam 2 Mg  Tabs (Alprazolam) .Marland Kitchen.. 1 By Mouth Three Times A Day As Needed Anxiety 4)  Phentermine Hcl 37.5 Mg Tabs (Phentermine Hcl) .Marland Kitchen.. 1 By Mouth Qd 5)  Hydrocodone-Acetaminophen 10-325 Mg Tabs (Hydrocodone-Acetaminophen) .Marland Kitchen.. 1 By Mouth Two Times A Day By Mouth Two Times A Day. 6)  Tretinoin 0.025 % Crea (Tretinoin) .... Use Qhs 7)  Triamcinolone Acetonide 0.5 % Crea (Triamcinolone Acetonide) .... Use Two Times A Day As Needed On Fingers 8)  Vimovo 500-20 Mg Tbec (Naproxen-Esomeprazole) .Marland Kitchen.. 1 By Mouth Once Daily - Two Times A Day Pc As Needed Pain  Allergies (verified): 1)  Naprosyn 2)  Darvocet 3)  Hydrocodone-Acetaminophen (Hydrocodone-Acetaminophen)  Past History:  Past Medical History: Last updated: 11/07/2009 GERD Anxiety COPD Chest pain, non cardiac Elev. glucose Wt gain Low back pain Depression [facility] lISA POULOS, NP  Social History: Last updated:  11/07/2009 Occupation: plumber Current Smoker Married 1 dtr Alcohol use-no Regular exercise-no  Physical Exam  General:  NAD well-developed and well-nourished.   Nose:  External nasal examination shows no deformity or inflammation. Nasal mucosa are pink and moist without lesions or exudates. Mouth:  Oral mucosa and oropharynx without lesions or exudates.  Teeth in good repair. Neck:  No deformities, masses, or tenderness noted. Lungs:  Normal respiratory effort, chest expands symmetrically. Lungs are clear to auscultation, no crackles or wheezes. Heart:  Normal rate and regular rhythm. S1 and S2 normal without gallop, murmur, click, rub or other extra sounds. Abdomen:  Bowel sounds positive,abdomen soft and non-tender without masses, organomegaly or hernias noted. Msk:  No deformity or scoliosis noted of thoracic or lumbar spine.  Lumbar-sacral spine is tender to palpation over paraspinal muscles and painfull with the ROM  Pulses:  R and L carotid,radial,femoral,dorsalis pedis and posterior tibial pulses are full and equal bilaterally Neurologic:  No cranial nerve deficits noted. Station and gait are normal. Plantar reflexes are down-going bilaterally. DTRs are symmetrical throughout. Sensory, motor and coordinative functions appear intact. Skin:  Dry skin - fingertips Psych:  Oriented X3, normally interactive, not suicidal, not homicidal, and less depressed affect.     Impression & Recommendations:  Problem # 1:  LOW BACK PAIN (ICD-724.2) Assessment Unchanged  His updated medication list for this problem includes:    Hydrocodone-acetaminophen 10-325 Mg  Tabs (Hydrocodone-acetaminophen) .Marland Kitchen... 1 by mouth two times a day by mouth two times a day. (ok to ref early in november)    Vimovo 500-20 Mg Tbec (Naproxen-esomeprazole) .Marland Kitchen... 1 by mouth once daily - two times a day pc as needed pain  Problem # 2:  ANXIETY (ICD-300.00) Assessment: Improved  His updated medication list for this  problem includes:    Prozac 20 Mg Caps (Fluoxetine hcl) ..... By mouth once daily    Alprazolam 2 Mg Tabs (Alprazolam) .Marland Kitchen... 1 by mouth three times a day as needed anxiety  Problem # 3:  OBESITY (ICD-278.00) Assessment: Unchanged On the regimen of medicine(s) reflected in the chart    Problem # 4:  BRONCHITIS, ACUTE (ICD-466.0) viral  Complete Medication List: 1)  Prozac 20 Mg Caps (Fluoxetine hcl) .... By mouth once daily 2)  Vitamin D3 1000 Unit Tabs (Cholecalciferol) .Marland Kitchen.. 1 qd 3)  Alprazolam 2 Mg Tabs (Alprazolam) .Marland Kitchen.. 1 by mouth three times a day as needed anxiety 4)  Phentermine Hcl 37.5 Mg Tabs (Phentermine hcl) .Marland Kitchen.. 1 by mouth qd 5)  Hydrocodone-acetaminophen 10-325 Mg Tabs (Hydrocodone-acetaminophen) .Marland Kitchen.. 1 by mouth two times a day by mouth two times a day. (ok to ref early in november) 6)  Tretinoin 0.025 % Crea (Tretinoin) .... Use qhs 7)  Triamcinolone Acetonide 0.5 % Crea (Triamcinolone acetonide) .... Use two times a day as needed on fingers 8)  Vimovo 500-20 Mg Tbec (Naproxen-esomeprazole) .Marland Kitchen.. 1 by mouth once daily - two times a day pc as needed pain  Patient Instructions: 1)  Please schedule a follow-up appointment in 3 months. Prescriptions: PHENTERMINE HCL 37.5 MG TABS (PHENTERMINE HCL) 1 by mouth qd  #30 x 2   Entered and Authorized by:   Tresa Garter MD   Signed by:   Tresa Garter MD on 02/05/2010   Method used:   Print then Give to Patient   RxID:   [id number] HYDROCODONE-ACETAMINOPHEN 10-325 MG TABS (HYDROCODONE-ACETAMINOPHEN) 1 by mouth two times a day by mouth two times a day. (OK to ref early in November)  #60 x 2   Entered and Authorized by:   Tresa Garter MD   Signed by:   Tresa Garter MD on 02/05/2010   Method used:   Print then Give to Patient   RxID:   [id number] TRIAMCINOLONE ACETONIDE 0.5 % CREA (TRIAMCINOLONE ACETONIDE) use two times a day as needed on fingers  #60 g x 3   Entered and Authorized by:   Tresa Garter MD   Signed by:   Tresa Garter MD on 02/05/2010   Method used:   Print then Give to Patient   RxID:   [id number]    Orders Added: 1)  Est. Patient Level III [59563]

## 2010-04-10 NOTE — Progress Notes (Signed)
Summary: Phentermine Rf  Phone Note Refill Request   Refills Requested: Medication #1:  PHENTERMINE HCL 37.5 MG TABS 1 by mouth qd   Dosage confirmed as above?Dosage Confirmed   Supply Requested: 30   Last Refilled: 10/29/2009 *****[facility]*******   Method Requested: Telephone to Pharmacy Initial call taken by: Lanier Prude, [facility]),  December 03, 2009 1:55 PM  Follow-up for Phone Call        ok 2 ref Follow-up by: Tresa Garter MD,  December 03, 2009 6:03 PM  Additional Follow-up for Phone Call Additional follow up Details #1::        Rx called to pharmacy Additional Follow-up by: Lanier Prude, [facility]),  December 04, 2009 10:12 AM    Prescriptions: PHENTERMINE HCL 37.5 MG TABS (PHENTERMINE HCL) 1 by mouth qd  #30 x 2   Entered by:   Lanier Prude, CMA(AAMA)   Authorized by:   Tresa Garter MD   Signed by:   Lanier Prude, CMA(AAMA) on 12/04/2009   Method used:   Telephoned to ...       [facility]* (retail)       [address] [place], [place]  [address]       Ph: [phone] or [phone]       Fax: [phone]   RxID:   [id number]

## 2010-04-10 NOTE — Progress Notes (Signed)
Summary: Rf Alprazolam  Phone Note Refill Request Message from:  Fax from Pharmacy  Refills Requested: Medication #1:  ALPRAZOLAM 2 MG  TABS 1 by mouth three times a day as needed anxiety   Dosage confirmed as above?Dosage Confirmed   Supply Requested: 90   Last Refilled: 02/11/2010 ***[facility]   Method Requested: Telephone to Pharmacy Initial call taken by: Lanier Prude, [facility]),  April 01, 2010 12:02 PM  Follow-up for Phone Call        ok to ref x1 Follow-up by: Tresa Garter MD,  April 01, 2010 6:13 PM  Additional Follow-up for Phone Call Additional follow up Details #1::        Rx called to pharmacy Additional Follow-up by: Lanier Prude, [facility]),  April 02, 2010 10:08 AM    Prescriptions: ALPRAZOLAM 2 MG  TABS (ALPRAZOLAM) 1 by mouth three times a day as needed anxiety  #90 x 1   Entered by:   Lanier Prude, CMA(AAMA)   Authorized by:   Tresa Garter MD   Signed by:   Lanier Prude, CMA(AAMA) on 04/02/2010   Method used:   Telephoned to ...       [facility]* (retail)       [address] [place], [place]  [address]       Ph: [phone] or [phone]       Fax: [phone]   RxID:   [id number]

## 2010-04-10 NOTE — Progress Notes (Signed)
  Phone Note Refill Request Message from:  Fax from Pharmacy on May 30, 2009 10:10 AM  Refills Requested: Medication #1:  HYDROCODONE-ACETAMINOPHEN 10-325 MG TABS 1 by mouth two times a day by mouth bid   Last Refilled: 05/08/2009 Pt requesting Hydrocodone prescription be refilled and sent to [facility]  Initial call taken by: Ami Bullins CMA,  May 30, 2009 10:11 AM  Follow-up for Phone Call        patient called again regarding prescription. Follow-up by: Lucious Groves,  May 30, 2009 1:06 PM  Additional Follow-up for Phone Call Additional follow up Details #1::        ok to ref Additional Follow-up by: Tresa Garter MD,  May 30, 2009 1:18 PM     Appended Document:  NOT ok to fill see phone note from 3/24

## 2010-04-10 NOTE — Progress Notes (Signed)
Summary: Vicodin Rf early  Phone Note Refill Request Call back at [phone] Message from:  Fax from Pharmacy  Refills Requested: Medication #1:  HYDROCODONE-ACETAMINOPHEN 10-325 MG TABS 1 by mouth two times a day by mouth two times a day.   Dosage confirmed as above?Dosage Confirmed   Supply Requested: 90   Last Refilled: 01/18/2010 Early Rf ********[facility] [facility]*   Method Requested: Fax to Local Pharmacy Initial call taken by: Lanier Prude, [facility]),  February 03, 2010 10:51 AM  Follow-up for Phone Call        ok to ref - no early ref pls Follow-up by: Tresa Garter MD,  February 03, 2010 5:50 PM  Additional Follow-up for Phone Call Additional follow up Details #1::        Pt states he left his meds at his brother's house when he was out of town and is out now. Per pharm, pt filled # 90 on 11/12 Additional Follow-up by: Lamar Sprinkles, CMA,  February 03, 2010 5:57 PM    Additional Follow-up for Phone Call Additional follow up Details #2::    ok #12 Have brother mail the Rx Thank you!  Follow-up by: Tresa Garter MD,  February 03, 2010 5:59 PM  Prescriptions: HYDROCODONE-ACETAMINOPHEN 10-325 MG TABS (HYDROCODONE-ACETAMINOPHEN) 1 by mouth two times a day by mouth two times a day.  #12 x 0   Entered by:   Lamar Sprinkles, CMA   Authorized by:   Tresa Garter MD   Signed by:   Lamar Sprinkles, CMA on 02/04/2010   Method used:   Telephoned to ...       [id number]       [address]       [place], [place]  [address]       Ph: [phone] or [phone]       Fax: [phone]   RxID:   [id number]

## 2010-04-10 NOTE — Progress Notes (Signed)
Summary: Rf Hydroco/APAP 10/325-Plot pt.  Phone Note Refill Request Message from:  Fax from Pharmacy  Refills Requested: Medication #1:  HYDROCODONE-ACETAMINOPHEN 10-325 MG TABS 1 by mouth three times daily ok to fill 02/23/2010   Dosage confirmed as above?Dosage Confirmed   Supply Requested: 90   Last Refilled: 02/23/2010  Method Requested: Telephone to Pharmacy Next Appointment Scheduled: None Initial call taken by: Lanier Prude, [facility]),  March 25, 2010 9:00 AM  Follow-up for Phone Call        ok for refill with 2 add'l Follow-up by: Jacques Navy MD,  March 25, 2010 1:08 PM  Additional Follow-up for Phone Call Additional follow up Details #1::        Rx called to pharmacy Additional Follow-up by: Lanier Prude, [facility]),  March 25, 2010 3:46 PM    Prescriptions: HYDROCODONE-ACETAMINOPHEN 10-325 MG TABS (HYDROCODONE-ACETAMINOPHEN) 1 by mouth three times daily ok to fill 02/23/2010  #90 x 2   Entered by:   Lanier Prude, CMA(AAMA)   Authorized by:   Tresa Garter MD   Signed by:   Lanier Prude, CMA(AAMA) on 03/25/2010   Method used:   Telephoned to ...       [id number]       [address]       [place], [place]  [address]       Ph: [phone] or [phone]       Fax: [phone]   RxID:   [id number]

## 2010-04-10 NOTE — Assessment & Plan Note (Signed)
Summary: FU--STC   Vital Signs:  Patient profile:   40 year old male Weight:      171 pounds BMI:     26.09 Temp:     97.7 degrees F oral Pulse rate:   88 / minute Resp:     16 per minute BP sitting:   110 / 70  (left arm) Cuff size:   regular  Vitals Entered By: Waldron Labs, CMA(AAMA) (August 27, 2009 9:57 AM) CC: 3 mo f/u Comments pt states he is not allergic to naprosyn, Darvocet or Hydrocodon-acetamin.  Please remove from list.     CC:  3 mo f/u.  History of Present Illness: The patient presents for a follow up of back pain, anxiety, obesity C/o OA  Current Medications (verified): 1)  Prozac 20 Mg  Caps (Fluoxetine Hcl) .... By Mouth Once Daily 2)  Vitamin D3 1000 Unit  Tabs (Cholecalciferol) .Marland Kitchen.. 1 Qd 3)  Alprazolam 2 Mg  Tabs (Alprazolam) .Marland Kitchen.. 1 By Mouth Three Times A Day As Needed Anxiety 4)  Phentermine Hcl 37.5 Mg Tabs (Phentermine Hcl) .Marland Kitchen.. 1 By Mouth Qd 5)  Hydrocodone-Acetaminophen 10-325 Mg Tabs (Hydrocodone-Acetaminophen) .Marland Kitchen.. 1 By Mouth Two Times A Day By Mouth Two Times A Day. Ok To [place] Today. 6)  Tretinoin 0.025 % Crea (Tretinoin) .... Use Qhs 7)  Triamcinolone Acetonide 0.5 % Crea (Triamcinolone Acetonide) .... Use Two Times A Day As Needed On Fingers  Allergies: 1)  Naprosyn 2)  Darvocet 3)  Hydrocodone-Acetaminophen (Hydrocodone-Acetaminophen)  Past History:  Past Medical History: Last updated: 05/24/2008 GERD Anxiety COPD Chest pain, non cardiac Elev. glucose Wt gain Low back pain  Social History: Last updated: 02/07/2007 Occupation: plumber Current Smoker  Review of Systems  The patient denies weight loss, weight gain, dyspnea on exertion, and abdominal pain.    Physical Exam  General:  NAD well-developed and well-nourished.   Mouth:  Oral mucosa and oropharynx without lesions or exudates.  Teeth in good repair. Lungs:  Normal respiratory effort, chest expands symmetrically. Lungs are clear to auscultation, no crackles or  wheezes. Heart:  Normal rate and regular rhythm. S1 and S2 normal without gallop, murmur, click, rub or other extra sounds. Abdomen:  Bowel sounds positive,abdomen soft and non-tender without masses, organomegaly or hernias noted. Msk:  Lumbar-sacral spine is tender to palpation over paraspinal muscles and painfull with the ROM  Skin:  Clear w/mild aging changes Psych:  Oriented X3.     Impression & Recommendations:  Problem # 1:  OBESITY (ICD-278.00) Assessment Improved On prescription drug  therapy  Risks vs benefits and controversies of a long term wt loss med use were discussed.  Problem # 2:  CHEST PAIN, UNSPECIFIED (ICD-786.50) Assessment: Improved  Problem # 3:  ABNORMAL GLUCOSE NEC (ICD-790.29) Assessment: Improved  Problem # 4:  BACK PAIN (ICD-724.5) Assessment: Improved  The following medications were removed from the medication list:    Naproxen 500 Mg Tabs (Naproxen) .Marland Kitchen... 1 by mouth two times a day pc for pain/arthritis His updated medication list for this problem includes:    Hydrocodone-acetaminophen 10-325 Mg Tabs (Hydrocodone-acetaminophen) .Marland Kitchen... 1 by mouth two times a day by mouth two times a day. ok to fill today.    Vimovo 500-20 Mg Tbec (Naproxen-esomeprazole) .Marland Kitchen... 1 by mouth once daily - two times a day pc as needed pain  Problem # 5:  ANXIETY (ICD-300.00) Assessment: Improved  His updated medication list for this problem includes:    Prozac 20 Mg Caps (Fluoxetine hcl) .Marland KitchenMarland KitchenMarland KitchenMarland Kitchen  By mouth once daily    Alprazolam 2 Mg Tabs (Alprazolam) .Marland Kitchen... 1 by mouth three times a day as needed anxiety  Problem # 6:  COPD (ICD-496) Assessment: Unchanged D/c smoking adviced  Problem # 7:  TOBACCO USE DISORDER/SMOKER-SMOKING CESSATION DISCUSSED (ICD-305.1) Assessment: Unchanged  Encouraged smoking cessation and discussed different methods for smoking cessation.   Complete Medication List: 1)  Prozac 20 Mg Caps (Fluoxetine hcl) .... By mouth once daily 2)  Vitamin D3  1000 Unit Tabs (Cholecalciferol) .Marland Kitchen.. 1 qd 3)  Alprazolam 2 Mg Tabs (Alprazolam) .Marland Kitchen.. 1 by mouth three times a day as needed anxiety 4)  Phentermine Hcl 37.5 Mg Tabs (Phentermine hcl) .Marland Kitchen.. 1 by mouth qd 5)  Hydrocodone-acetaminophen 10-325 Mg Tabs (Hydrocodone-acetaminophen) .Marland Kitchen.. 1 by mouth two times a day by mouth two times a day. ok to fill today. 6)  Tretinoin 0.025 % Crea (Tretinoin) .... Use qhs 7)  Triamcinolone Acetonide 0.5 % Crea (Triamcinolone acetonide) .... Use two times a day as needed on fingers 8)  Vimovo 500-20 Mg Tbec (Naproxen-esomeprazole) .Marland Kitchen.. 1 by mouth once daily - two times a day pc as needed pain  Patient Instructions: 1)  Please schedule a follow-up appointment in 3 months. Prescriptions: VIMOVO 500-20 MG TBEC (NAPROXEN-ESOMEPRAZOLE) 1 by mouth once daily - two times a day pc as needed pain  #60 x 3   Entered and Authorized by:   Tresa Garter MD   Signed by:   Tresa Garter MD on 08/27/2009   Method used:   Print then Give to Patient   RxID:   [id number] NAPROXEN 500 MG TABS (NAPROXEN) 1 by mouth two times a day pc for pain/arthritis  #60 x 3   Entered and Authorized by:   Tresa Garter MD   Signed by:   Tresa Garter MD on 08/27/2009   Method used:   Print then Give to Patient   RxID:   [id number] PROZAC 20 MG  CAPS (FLUOXETINE HCL) by mouth once daily  #90 x 1   Entered and Authorized by:   Tresa Garter MD   Signed by:   Tresa Garter MD on 08/27/2009   Method used:   Print then Give to Patient   RxID:   [id number] HYDROCODONE-ACETAMINOPHEN 10-325 MG TABS (HYDROCODONE-ACETAMINOPHEN) 1 by mouth two times a day by mouth two times a day. OK to fill today.  #60 x 2   Entered and Authorized by:   Tresa Garter MD   Signed by:   Tresa Garter MD on 08/27/2009   Method used:   Print then Give to Patient   RxID:   [id number] PHENTERMINE HCL 37.5 MG TABS (PHENTERMINE HCL) 1 by mouth qd  #30 x  2   Entered and Authorized by:   Tresa Garter MD   Signed by:   Tresa Garter MD on 08/27/2009   Method used:   Print then Give to Patient   RxID:   [id number] ALPRAZOLAM 2 MG  TABS (ALPRAZOLAM) 1 by mouth three times a day as needed anxiety  #90 x 2   Entered and Authorized by:   Tresa Garter MD   Signed by:   Tresa Garter MD on 08/27/2009   Method used:   Print then Give to Patient   RxID:   [id number]

## 2010-04-14 ENCOUNTER — Ambulatory Visit: Payer: Self-pay | Admitting: Internal Medicine

## 2010-05-26 ENCOUNTER — Telehealth: Payer: Self-pay | Admitting: Internal Medicine

## 2010-06-05 NOTE — Progress Notes (Signed)
Summary: refill request  Phone Note Call from Patient Call back at Home Phone [phone]   Caller: Patient [phone] Reason for Call: Refill Medication Summary of Call: Pt requesting refill on Hydrocodone-APAP.  Pt states that he has switched pharmacies due to cost. His new pharmacy: [facility] [place] Initial call taken by: Burnard Leigh [facility]),  May 26, 2010 11:46 AM  Follow-up for Phone Call        ok to ref x 1 Follow-up by: Tresa Garter MD,  May 26, 2010 6:41 PM  Additional Follow-up for Phone Call Additional follow up Details #1::        Rx Called In/pt informed  Additional Follow-up by: Lanier Prude, [facility]),  May 26, 2010 6:47 PM    Prescriptions: HYDROCODONE-ACETAMINOPHEN 10-325 MG TABS (HYDROCODONE-ACETAMINOPHEN) 1 by mouth three times daily ok to fill 02/23/2010  #90 x 1   Entered by:   Lanier Prude, CMA(AAMA)   Authorized by:   Tresa Garter MD   Signed by:   Lanier Prude, CMA(AAMA) on 05/26/2010   Method used:   Telephoned to ...       [facility]       [address]       [place], [place]  [address]       Ph: [phone]       Fax: [phone]   RxID:   [id number]

## 2010-06-16 ENCOUNTER — Telehealth: Payer: Self-pay | Admitting: *Deleted

## 2010-06-16 NOTE — Telephone Encounter (Signed)
Patient requesting early refill of hydrocodone. Per pharm, last refill was 3/19 for #90. Due for refill as early as 4/16. Please advise.

## 2010-06-17 NOTE — Telephone Encounter (Signed)
Pt advised that [facility] declined authorizing early refill at this time. Pt states he is going out of town and this is why he requested early refill. Pt advised that he can have Rx refilled at any pharmacy chain [facility] and he should verify this with pharmacy directly. Pt agreed and understood.

## 2010-06-17 NOTE — Telephone Encounter (Signed)
No early refill please

## 2010-07-02 ENCOUNTER — Telehealth: Payer: Self-pay | Admitting: *Deleted

## 2010-07-02 NOTE — Telephone Encounter (Signed)
OK to fill this prescription with additional refills x1 Thank you!  

## 2010-07-02 NOTE — Telephone Encounter (Signed)
rec Rf req for phentermine 37.5  1 po qd # 30... Last filled 05-17-10... Ok to Rf?

## 2010-07-03 MED ORDER — PHENTERMINE HCL 37.5 MG PO CAPS: 37.5000 mg | ORAL_CAPSULE | ORAL | Status: DC

## 2010-07-09 ENCOUNTER — Telehealth: Payer: Self-pay | Admitting: *Deleted

## 2010-07-09 MED ORDER — HYDROCODONE-ACETAMINOPHEN 10-325 MG PO TABS: 1.0000 | ORAL_TABLET | Freq: Three times a day (TID) | ORAL | Status: DC | PRN

## 2010-07-09 NOTE — Telephone Encounter (Signed)
Pt req rf on Hydroco/APAP 10/325 1 po tid # 90. Please advise ok to Rf in Dr. Loren Racer absence?

## 2010-07-09 NOTE — Telephone Encounter (Signed)
ok 

## 2010-07-09 NOTE — Telephone Encounter (Signed)
rx signed/faxed. 

## 2010-07-14 ENCOUNTER — Telehealth: Payer: Self-pay | Admitting: *Deleted

## 2010-07-14 NOTE — Telephone Encounter (Signed)
rec rf req from [facility] [address] for Hydroco/APAP 10-325 1 po tid. # 90. I called [facility] [address] to see if they rec authorization that we faxed back on 07-09-10. They state they did not rec it so  I called in Rf  To [facility] [address]

## 2010-07-14 NOTE — Telephone Encounter (Signed)
Thx

## 2010-07-22 NOTE — Assessment & Plan Note (Signed)
[facility]                                 ON-CALL NOTE   NAME:Lindon, ADIT RIDDLES                       MRN:          [id number]  DATE:09/09/2007                            DOB:          1970-03-28    Time received is 2:07 p.m.  The patient is Shane Rich.  The caller is  the same.  He sees Dr. Posey Rea.  Telephone  [phone].  The patient  takes Vicodin for chronic low back pain and he is asking for a refill.  We told him that he would need to contact Dr. Posey Rea next week for a  refill on this.     Tera Mater. Clent Ridges, MD  Electronically Signed    SAF/MedQ  DD: 09/09/2007  DT: 09/09/2007  Job #: [id number]

## 2010-07-25 NOTE — Assessment & Plan Note (Signed)
[facility]                                 ON-CALL NOTE   NAME:Shane Rich, Shane Rich                       MRN:          [id number]  DATE:02/17/2006                            DOB:          [birth year]    CALLER:  Audree Bane.   TIME OF CALL:  5:37pm.   TELEPHONE NUMBER:  [phone].   REGULAR DOCTORS:  Dr. Posey Rea and Dr. Milinda Antis.   CHIEF COMPLAINT:  Anger problem, needs medicine. The patient's wife is  calling. She says that she has called Dr. Loren Racer office two days in  a row, and has failed to get a call back. She said that her husband  misplaced one week of his pills. She thinks it is Xanax, and has been  out since Saturday, and that the office has failed to call her back  regarding calling in more. She is very angry about his situation  currently. She states he is extremely irritable. I told Mrs. Topper that  I could not call in Xanax after hours, but that I would contact Dr.  Loren Racer office in the morning, and to let them know that she is  trying to reach them, and the plan would be dependent on what Dr.  Posey Rea decided to do. If his condition gets much worse, I advised  them to go to the emergency room.     Marne A. Tower, MD  Electronically Signed    MAT/MedQ  DD: 02/17/2006  DT: 02/18/2006  Job #: [id number]   cc:   Marne A. Tower, MD  Georgina Quint. Plotnikov, MD

## 2010-08-06 ENCOUNTER — Telehealth: Payer: Self-pay | Admitting: *Deleted

## 2010-08-06 NOTE — Telephone Encounter (Signed)
Pt req RF of Vicodin.

## 2010-08-07 ENCOUNTER — Telehealth: Payer: Self-pay | Admitting: *Deleted

## 2010-08-07 MED ORDER — HYDROCODONE-ACETAMINOPHEN 10-325 MG PO TABS: 1.0000 | ORAL_TABLET | Freq: Three times a day (TID) | ORAL | Status: DC

## 2010-08-07 NOTE — Telephone Encounter (Signed)
rf phoned in 

## 2010-08-07 NOTE — Telephone Encounter (Signed)
rec rf req for Hydroco/APAP 10-325 1 po tid #90. Last filled 07-14-10. Ok to Rf?

## 2010-08-07 NOTE — Telephone Encounter (Signed)
OK to fill this prescription with additional refills x0 Thank you!  

## 2010-09-05 ENCOUNTER — Telehealth: Payer: Self-pay | Admitting: *Deleted

## 2010-09-05 MED ORDER — HYDROCODONE-ACETAMINOPHEN 10-325 MG PO TABS: 1.0000 | ORAL_TABLET | Freq: Three times a day (TID) | ORAL | Status: DC

## 2010-09-05 NOTE — Telephone Encounter (Signed)
Pt is requesting refill on hydrocodone 10/325, pt would like rx printed so he can pick up. Please advise refills

## 2010-09-05 NOTE — Telephone Encounter (Signed)
Pt informed rx ready for p/u.

## 2010-09-05 NOTE — Telephone Encounter (Signed)
Left mess for patient to call back.  

## 2010-09-05 NOTE — Telephone Encounter (Signed)
Ok X 1 per Dr. Posey Rea. Rx printed

## 2010-10-07 ENCOUNTER — Telehealth: Payer: Self-pay | Admitting: *Deleted

## 2010-10-07 ENCOUNTER — Telehealth: Payer: Self-pay

## 2010-10-07 NOTE — Telephone Encounter (Signed)
Patient requesting RF of hydrocodone to [facility] to [facility] [address]

## 2010-10-07 NOTE — Telephone Encounter (Signed)
Received fax from [facility] requesting alprazolam 2 mg. Last filled 04/01/10 #90/1rf pt last seen 01/2010. Rx denied and request faxed back to pharmacy

## 2010-10-08 MED ORDER — HYDROCODONE-ACETAMINOPHEN 10-325 MG PO TABS: 1.0000 | ORAL_TABLET | Freq: Three times a day (TID) | ORAL | Status: DC

## 2010-10-08 NOTE — Telephone Encounter (Signed)
Called in RX - left pt VM to check w/pharm and call office for f/u

## 2010-10-08 NOTE — Telephone Encounter (Signed)
Needs OV Thx 

## 2010-10-08 NOTE — Telephone Encounter (Signed)
No ov since November - ok for a 1 month supply - needs OV with Dr. Roena Malady

## 2010-10-20 ENCOUNTER — Telehealth: Payer: Self-pay | Admitting: *Deleted

## 2010-10-20 MED ORDER — FLUOXETINE HCL 20 MG PO CAPS: 20.0000 mg | ORAL_CAPSULE | Freq: Every day | ORAL | Status: DC

## 2010-10-20 NOTE — Telephone Encounter (Signed)
Pt calling stating he scheduled appt for 12-11-10 but needs Rf on med before then but did not specify what meds. I called and left a detailed message advising pt to let us know what he needs.

## 2010-10-20 NOTE — Telephone Encounter (Signed)
Pt needs Fluoxetine, Xanax and Hydrocodone. Please advise ok to Rf?

## 2010-10-20 NOTE — Telephone Encounter (Signed)
Pt informed. Appt made for tom 10-21-10 at 3:30.   Pt is requesting # 3 Hydroco/APAP until then. Please advise

## 2010-10-20 NOTE — Telephone Encounter (Signed)
He needs to move up his appt. OK to ref Fluoxetine x 3 Thx

## 2010-10-21 ENCOUNTER — Ambulatory Visit (INDEPENDENT_AMBULATORY_CARE_PROVIDER_SITE_OTHER): Payer: Self-pay | Admitting: Internal Medicine

## 2010-10-21 ENCOUNTER — Encounter: Payer: Self-pay | Admitting: Internal Medicine

## 2010-10-21 DIAGNOSIS — M545 Low back pain, unspecified: Secondary | ICD-10-CM

## 2010-10-21 DIAGNOSIS — F411 Generalized anxiety disorder: Secondary | ICD-10-CM

## 2010-10-21 DIAGNOSIS — F3289 Other specified depressive episodes: Secondary | ICD-10-CM

## 2010-10-21 DIAGNOSIS — F329 Major depressive disorder, single episode, unspecified: Secondary | ICD-10-CM

## 2010-10-21 MED ORDER — HYDROCODONE-ACETAMINOPHEN 10-325 MG PO TABS: 1.0000 | ORAL_TABLET | Freq: Three times a day (TID) | ORAL | Status: DC

## 2010-10-21 MED ORDER — ALPRAZOLAM 2 MG PO TABS: 2.0000 mg | ORAL_TABLET | Freq: Three times a day (TID) | ORAL | Status: DC | PRN

## 2010-10-21 NOTE — Progress Notes (Signed)
  Subjective:    Patient ID: Shane Rich, male    DOB: 1970-10-09, 40 y.o.   MRN: [id number]  HPI   The patient is here to follow up on chronic depression, anxiety  and chronic moderate LBP symptoms controlled with medicines  and exercise. C/o LLE burn x 2 d ago    Review of Systems  Constitutional: Negative for fatigue.  HENT: Negative for nosebleeds.   Respiratory: Negative for cough.   Gastrointestinal: Negative for nausea.  Genitourinary: Negative for frequency.  Musculoskeletal: Positive for back pain.  Neurological: Negative for light-headedness.  Psychiatric/Behavioral: Positive for sleep disturbance. Negative for suicidal ideas. The patient is nervous/anxious.        Objective:   Physical Exam  Constitutional: He is oriented to person, place, and time. He appears well-developed.  HENT:  Mouth/Throat: Oropharynx is clear and moist.  Eyes: Conjunctivae are normal. Pupils are equal, round, and reactive to light.  Neck: Normal range of motion. No JVD present. No thyromegaly present.  Cardiovascular: Normal rate, regular rhythm, normal heart sounds and intact distal pulses.  Exam reveals no gallop and no friction rub.   No murmur heard. Pulmonary/Chest: Effort normal and breath sounds normal. No respiratory distress. He has no wheezes. He has no rales. He exhibits no tenderness.  Abdominal: Soft. Bowel sounds are normal. He exhibits no distension and no mass. There is no tenderness. There is no rebound and no guarding.  Musculoskeletal: Normal range of motion. He exhibits tenderness. He exhibits no edema.  Lymphadenopathy:    He has no cervical adenopathy.  Neurological: He is alert and oriented to person, place, and time. He has normal reflexes. No cranial nerve deficit. He exhibits normal muscle tone. Coordination normal.  Skin: Skin is warm and dry. No rash noted.  Psychiatric: He has a normal mood and affect. His behavior is normal. Judgment and thought content normal.           Assessment & Plan:

## 2010-11-03 NOTE — Assessment & Plan Note (Signed)
On RX 

## 2010-11-03 NOTE — Assessment & Plan Note (Signed)
Cont Rx 

## 2010-11-03 NOTE — Assessment & Plan Note (Signed)
On Rx 

## 2010-11-10 ENCOUNTER — Other Ambulatory Visit: Payer: Self-pay | Admitting: Internal Medicine

## 2010-11-11 ENCOUNTER — Telehealth: Payer: Self-pay | Admitting: *Deleted

## 2010-11-11 NOTE — Telephone Encounter (Signed)
OK early ref now and time the next refill accordingly (30 days + # days before scheduled refill) Thx

## 2010-11-11 NOTE — Telephone Encounter (Signed)
Pt calling stating he is leaving for a family vacation soon and is requesting early Rf on Hydroco/APAP 10-325 tid # 90. Ok?

## 2010-11-11 NOTE — Telephone Encounter (Signed)
Pt/pharmacy informed

## 2010-11-24 ENCOUNTER — Other Ambulatory Visit: Payer: Self-pay | Admitting: Internal Medicine

## 2010-11-25 ENCOUNTER — Telehealth: Payer: Self-pay | Admitting: *Deleted

## 2010-11-25 NOTE — Telephone Encounter (Signed)
Pharm faxed RF request -  XANAX 2 mg TID #60 [facility] [place]

## 2010-11-26 NOTE — Telephone Encounter (Signed)
OK to fill this prescription with additional refills x1 Thank you!  

## 2010-11-27 MED ORDER — ALPRAZOLAM 2 MG PO TABS: 2.0000 mg | ORAL_TABLET | Freq: Three times a day (TID) | ORAL | Status: DC | PRN

## 2010-12-11 ENCOUNTER — Encounter: Payer: Self-pay | Admitting: Internal Medicine

## 2010-12-11 DIAGNOSIS — Z0289 Encounter for other administrative examinations: Secondary | ICD-10-CM

## 2010-12-31 ENCOUNTER — Encounter: Payer: Self-pay | Admitting: Internal Medicine

## 2010-12-31 ENCOUNTER — Ambulatory Visit (INDEPENDENT_AMBULATORY_CARE_PROVIDER_SITE_OTHER): Payer: Self-pay | Admitting: Internal Medicine

## 2010-12-31 VITALS — BP 118/70 | HR 84 | Temp 98.4°F | Resp 16 | Wt 175.0 lb

## 2010-12-31 DIAGNOSIS — M7710 Lateral epicondylitis, unspecified elbow: Secondary | ICD-10-CM | POA: Insufficient documentation

## 2010-12-31 DIAGNOSIS — Z23 Encounter for immunization: Secondary | ICD-10-CM

## 2010-12-31 DIAGNOSIS — M545 Low back pain: Secondary | ICD-10-CM

## 2010-12-31 DIAGNOSIS — Z0000 Encounter for general adult medical examination without abnormal findings: Secondary | ICD-10-CM

## 2010-12-31 DIAGNOSIS — N529 Male erectile dysfunction, unspecified: Secondary | ICD-10-CM

## 2010-12-31 MED ORDER — IBUPROFEN 600 MG PO TABS: ORAL_TABLET | ORAL | Status: DC

## 2010-12-31 MED ORDER — HYDROCODONE-ACETAMINOPHEN 10-325 MG PO TABS: 1.0000 | ORAL_TABLET | Freq: Three times a day (TID) | ORAL | Status: DC

## 2010-12-31 MED ORDER — VARDENAFIL HCL 20 MG PO TABS: 20.0000 mg | ORAL_TABLET | Freq: Every day | ORAL | Status: AC | PRN

## 2010-12-31 NOTE — Progress Notes (Signed)
  Subjective:    Patient ID: Shane Rich, male    DOB: Apr 29, 1970, 40 y.o.   MRN: [id number]  HPI C/o L elbow Pain and swelling. C/o ED. F/u LBP   Review of Systems  Constitutional: Negative for appetite change, fatigue and unexpected weight change.  HENT: Negative for nosebleeds, congestion, sore throat, sneezing, trouble swallowing and neck pain.   Eyes: Negative for itching and visual disturbance.  Respiratory: Negative for cough.   Cardiovascular: Negative for chest pain, palpitations and leg swelling.  Gastrointestinal: Negative for nausea, diarrhea, blood in stool and abdominal distention.  Genitourinary: Negative for frequency and hematuria.  Musculoskeletal: Positive for back pain. Negative for joint swelling and gait problem.  Skin: Negative for rash.  Neurological: Negative for dizziness, tremors, speech difficulty and weakness.  Psychiatric/Behavioral: Negative for suicidal ideas, sleep disturbance, dysphoric mood and agitation. The patient is nervous/anxious.        Objective:   Physical Exam  Constitutional: He is oriented to person, place, and time. He appears well-developed.  HENT:  Mouth/Throat: Oropharynx is clear and moist.  Eyes: Conjunctivae are normal. Pupils are equal, round, and reactive to light.  Neck: Normal range of motion. No JVD present. No thyromegaly present.  Cardiovascular: Normal rate, regular rhythm, normal heart sounds and intact distal pulses.  Exam reveals no gallop and no friction rub.   No murmur heard. Pulmonary/Chest: Effort normal and breath sounds normal. No respiratory distress. He has no wheezes. He has no rales. He exhibits no tenderness.  Abdominal: Soft. Bowel sounds are normal. He exhibits no distension and no mass. There is no tenderness. There is no rebound and no guarding.  Musculoskeletal: Normal range of motion. He exhibits tenderness (LS spine is tender w/ROM). He exhibits no edema.  Lymphadenopathy:    He has no cervical  adenopathy.  Neurological: He is alert and oriented to person, place, and time. He has normal reflexes. No cranial nerve deficit. He exhibits normal muscle tone. Coordination normal.  Skin: Skin is warm and dry. No rash noted.  Psychiatric: He has a normal mood and affect. His behavior is normal. Judgment and thought content normal.          Assessment & Plan:

## 2010-12-31 NOTE — Assessment & Plan Note (Signed)
L elbow 10/12 NSAID, ice

## 2011-01-01 ENCOUNTER — Encounter: Payer: Self-pay | Admitting: Internal Medicine

## 2011-01-01 DIAGNOSIS — N529 Male erectile dysfunction, unspecified: Secondary | ICD-10-CM | POA: Insufficient documentation

## 2011-01-01 NOTE — Assessment & Plan Note (Signed)
Continue with current prescription therapy as reflected on the Med list.  

## 2011-01-01 NOTE — Assessment & Plan Note (Signed)
Discussed See Meds 

## 2011-01-08 ENCOUNTER — Telehealth: Payer: Self-pay | Admitting: *Deleted

## 2011-01-08 NOTE — Telephone Encounter (Signed)
Pt called stating he had to leave town urgently and left medications at home. He will be in [place] for 10 days and is requesting a 10 day supply of Hydrocodone and [phone]  Please advise.

## 2011-01-09 MED ORDER — HYDROCODONE-ACETAMINOPHEN 10-325 MG PO TABS: 1.0000 | ORAL_TABLET | Freq: Three times a day (TID) | ORAL | Status: DC

## 2011-01-09 MED ORDER — FLUOXETINE HCL 20 MG PO CAPS: 20.0000 mg | ORAL_CAPSULE | Freq: Every day | ORAL | Status: DC

## 2011-01-09 NOTE — Telephone Encounter (Signed)
[facility]  Pt notified and is appreciative.  Pt states we went above and beyond.

## 2011-01-09 NOTE — Telephone Encounter (Signed)
Ok 10 d supply on both Thx

## 2011-01-09 NOTE — Telephone Encounter (Signed)
2nd call to triage.  Pt requesting refill.  Please advise.

## 2011-01-31 ENCOUNTER — Other Ambulatory Visit: Payer: Self-pay | Admitting: Internal Medicine

## 2011-02-02 ENCOUNTER — Other Ambulatory Visit: Payer: Self-pay | Admitting: *Deleted

## 2011-02-02 MED ORDER — HYDROCODONE-ACETAMINOPHEN 10-325 MG PO TABS: 1.0000 | ORAL_TABLET | Freq: Three times a day (TID) | ORAL | Status: DC

## 2011-02-02 NOTE — Telephone Encounter (Signed)
OK to fill this prescription with additional refills x0 if time Thank you!  

## 2011-02-02 NOTE — Telephone Encounter (Signed)
Done. LMOM to inform patient.

## 2011-02-02 NOTE — Telephone Encounter (Signed)
Request for Hydrocodone-APAP 10-325 mg [last refill 11.02.12 #30x0]

## 2011-02-04 ENCOUNTER — Other Ambulatory Visit: Payer: Self-pay | Admitting: Internal Medicine

## 2011-02-05 ENCOUNTER — Telehealth: Payer: Self-pay | Admitting: *Deleted

## 2011-02-05 ENCOUNTER — Other Ambulatory Visit: Payer: Self-pay | Admitting: Internal Medicine

## 2011-02-05 MED ORDER — ALPRAZOLAM 2 MG PO TABS: 2.0000 mg | ORAL_TABLET | Freq: Three times a day (TID) | ORAL | Status: DC | PRN

## 2011-02-05 NOTE — Telephone Encounter (Signed)
OK to fill this prescription with additional refills x0 Thank you!  

## 2011-02-05 NOTE — Telephone Encounter (Signed)
Requested Medications     alprazolam (XANAX) 2 MG tablet [Pharmacy Med Name: ALPRAZOLAM 2MG  TAB]   TAKE ONE TABLET BY MOUTH THREE TIMES DAILY AS NEEDED   Disp: 60 each R: 0 Start: 02/04/2011  Class: Normal   Originally ordered on: 10/21/2010  Last refill: 12/26/2010

## 2011-03-20 ENCOUNTER — Telehealth: Payer: Self-pay | Admitting: *Deleted

## 2011-03-20 ENCOUNTER — Other Ambulatory Visit: Payer: Self-pay | Admitting: Internal Medicine

## 2011-03-20 MED ORDER — HYDROCODONE-ACETAMINOPHEN 10-325 MG PO TABS: 1.0000 | ORAL_TABLET | Freq: Three times a day (TID) | ORAL | Status: DC

## 2011-03-20 NOTE — Telephone Encounter (Signed)
Will have schedulers contact pt to arrange OV. 

## 2011-03-20 NOTE — Telephone Encounter (Signed)
OK to fill this prescription with additional refills x0 Needs OV Thank you!  

## 2011-03-20 NOTE — Telephone Encounter (Signed)
Requested Medications     HYDROcodone-acetaminophen (NORCO) 10-325 MG per tablet [Pharmacy Med Name: HYDROCO/ACETAMIN 10-325MG  TAB]   TAKE ONE TABLET BY MOUTH THREE TIMES DAILY   Disp: 90 each Start: 03/20/2011  Class: Normal   Requested on: 02/02/2011   Originally ordered on: 08/07/2010  Last refill: 02/11/2011

## 2011-03-27 ENCOUNTER — Ambulatory Visit: Payer: Self-pay | Admitting: Internal Medicine

## 2011-04-02 ENCOUNTER — Ambulatory Visit: Payer: Self-pay | Admitting: Internal Medicine

## 2011-04-02 DIAGNOSIS — Z0289 Encounter for other administrative examinations: Secondary | ICD-10-CM

## 2011-04-08 ENCOUNTER — Other Ambulatory Visit: Payer: Self-pay | Admitting: Internal Medicine

## 2011-04-09 ENCOUNTER — Telehealth: Payer: Self-pay | Admitting: *Deleted

## 2011-04-09 NOTE — Telephone Encounter (Signed)
Pt is requesting a short term supply of Fluoxetine, Hydroco/APAP and alprazolam until he comes in on 04-14-11. Please advise.

## 2011-04-09 NOTE — Telephone Encounter (Signed)
Also, pt req Rf on phentermine. Please advise on all requests.

## 2011-04-10 MED ORDER — FLUOXETINE HCL 20 MG PO CAPS: 20.0000 mg | ORAL_CAPSULE | Freq: Every day | ORAL | Status: DC

## 2011-04-10 MED ORDER — ALPRAZOLAM 2 MG PO TABS: 2.0000 mg | ORAL_TABLET | Freq: Three times a day (TID) | ORAL | Status: DC | PRN

## 2011-04-10 MED ORDER — HYDROCODONE-ACETAMINOPHEN 10-325 MG PO TABS: 1.0000 | ORAL_TABLET | Freq: Three times a day (TID) | ORAL | Status: DC

## 2011-04-10 NOTE — Telephone Encounter (Signed)
Ok the # to cover till OV on 2/5 Thx

## 2011-04-10 NOTE — Telephone Encounter (Signed)
Left detailed mess informing pt rfs phoned in and to keep OV on 04/14/11.

## 2011-04-14 ENCOUNTER — Ambulatory Visit (INDEPENDENT_AMBULATORY_CARE_PROVIDER_SITE_OTHER): Payer: Self-pay | Admitting: Internal Medicine

## 2011-04-14 ENCOUNTER — Encounter: Payer: Self-pay | Admitting: Internal Medicine

## 2011-04-14 DIAGNOSIS — N529 Male erectile dysfunction, unspecified: Secondary | ICD-10-CM

## 2011-04-14 DIAGNOSIS — M545 Low back pain: Secondary | ICD-10-CM

## 2011-04-14 DIAGNOSIS — F411 Generalized anxiety disorder: Secondary | ICD-10-CM

## 2011-04-14 MED ORDER — FLUOXETINE HCL 20 MG PO CAPS: 20.0000 mg | ORAL_CAPSULE | Freq: Every day | ORAL | Status: DC

## 2011-04-14 MED ORDER — ALPRAZOLAM 2 MG PO TABS: 2.0000 mg | ORAL_TABLET | Freq: Three times a day (TID) | ORAL | Status: DC | PRN

## 2011-04-14 MED ORDER — HYDROCODONE-ACETAMINOPHEN 10-325 MG PO TABS: 1.0000 | ORAL_TABLET | Freq: Three times a day (TID) | ORAL | Status: DC

## 2011-04-14 MED ORDER — PHENTERMINE HCL 37.5 MG PO CAPS: 37.5000 mg | ORAL_CAPSULE | ORAL | Status: DC

## 2011-04-14 MED ORDER — TADALAFIL 5 MG PO TABS: 5.0000 mg | ORAL_TABLET | Freq: Every day | ORAL | Status: DC | PRN

## 2011-04-14 NOTE — Assessment & Plan Note (Signed)
Continue with current prescription therapy as reflected on the Med list.  

## 2011-04-14 NOTE — Assessment & Plan Note (Signed)
cialis 5 mg/d

## 2011-04-14 NOTE — Progress Notes (Signed)
  Subjective:    Patient ID: Shane Rich, male    DOB: 1970-04-01, 41 y.o.   MRN: [id number]  HPI   The patient is here to follow up on chronic depression, anxiety, headaches and chronic moderate LBP symptoms  C/o ED   Review of Systems  Constitutional: Negative for appetite change, fatigue and unexpected weight change.  HENT: Negative for nosebleeds, congestion, sore throat, sneezing, trouble swallowing and neck pain.   Eyes: Negative for itching and visual disturbance.  Respiratory: Negative for cough.   Cardiovascular: Negative for chest pain, palpitations and leg swelling.  Gastrointestinal: Negative for nausea, diarrhea, blood in stool and abdominal distention.  Genitourinary: Negative for frequency and hematuria.  Musculoskeletal: Positive for back pain. Negative for joint swelling and gait problem.  Skin: Negative for rash.  Neurological: Negative for dizziness, tremors, speech difficulty and weakness.  Psychiatric/Behavioral: Positive for sleep disturbance. Negative for suicidal ideas, dysphoric mood and agitation. The patient is nervous/anxious.        Objective:   Physical Exam  Constitutional: He is oriented to person, place, and time. He appears well-developed.  HENT:  Mouth/Throat: Oropharynx is clear and moist.  Eyes: Conjunctivae are normal. Pupils are equal, round, and reactive to light.  Neck: Normal range of motion. No JVD present. No thyromegaly present.  Cardiovascular: Normal rate, regular rhythm, normal heart sounds and intact distal pulses.  Exam reveals no gallop and no friction rub.   No murmur heard. Pulmonary/Chest: Effort normal and breath sounds normal. No respiratory distress. He has no wheezes. He has no rales. He exhibits no tenderness.  Abdominal: Soft. Bowel sounds are normal. He exhibits no distension and no mass. There is no tenderness. There is no rebound and no guarding.  Musculoskeletal: Normal range of motion. He exhibits no edema and no  tenderness.  Lymphadenopathy:    He has no cervical adenopathy.  Neurological: He is alert and oriented to person, place, and time. He has normal reflexes. No cranial nerve deficit. He exhibits normal muscle tone. Coordination normal.  Skin: Skin is warm and dry. No rash noted.  Psychiatric: His behavior is normal. Judgment and thought content normal.          Assessment & Plan:

## 2011-04-20 ENCOUNTER — Encounter: Payer: Self-pay | Admitting: Internal Medicine

## 2011-05-18 ENCOUNTER — Telehealth: Payer: Self-pay | Admitting: *Deleted

## 2011-05-18 MED ORDER — FLUOXETINE HCL 20 MG PO CAPS: 20.0000 mg | ORAL_CAPSULE | Freq: Every day | ORAL | Status: DC

## 2011-05-18 MED ORDER — HYDROCODONE-ACETAMINOPHEN 10-325 MG PO TABS: 1.0000 | ORAL_TABLET | Freq: Three times a day (TID) | ORAL | Status: DC

## 2011-05-18 NOTE — Telephone Encounter (Signed)
Per Dr Posey Rea- call in # 10 of each and schedule OV tom or Wed. Pt informed. He only requests Rx for Hydroco/APAP and fluoxetine. Gave pt written Rxs and advised to schedule OV.   Pt calling stating his wife took his xanax,  fluoxetine and hydroco/apap and flushed them down the toilet. He states he is starting to have withdrawal symptoms and cant work.

## 2011-05-19 ENCOUNTER — Encounter: Payer: Self-pay | Admitting: Internal Medicine

## 2011-05-19 ENCOUNTER — Ambulatory Visit (INDEPENDENT_AMBULATORY_CARE_PROVIDER_SITE_OTHER): Payer: Self-pay | Admitting: Internal Medicine

## 2011-05-19 DIAGNOSIS — F411 Generalized anxiety disorder: Secondary | ICD-10-CM

## 2011-05-19 DIAGNOSIS — M545 Low back pain: Secondary | ICD-10-CM

## 2011-05-19 MED ORDER — HYDROCODONE-ACETAMINOPHEN 10-325 MG PO TABS: 1.0000 | ORAL_TABLET | Freq: Three times a day (TID) | ORAL | Status: DC

## 2011-05-19 MED ORDER — FLUOXETINE HCL 20 MG PO CAPS: 20.0000 mg | ORAL_CAPSULE | Freq: Every day | ORAL | Status: DC

## 2011-05-19 MED ORDER — PHENTERMINE HCL 37.5 MG PO CAPS: 37.5000 mg | ORAL_CAPSULE | ORAL | Status: DC

## 2011-05-19 NOTE — Progress Notes (Signed)
Patient ID: Shane Rich, male   DOB: Sep 18, 1970, 41 y.o.   MRN: [id number]  Subjective:    Patient ID: Shane Rich, male    DOB: 1970/10/18, 41 y.o.   MRN: [id number]  HPI   The patient is here to follow up on chronic depression, anxiety, headaches and chronic moderate LBP symptoms. C/o wife threw his meds out the other day - he had some withdrawal over wknd. Better now  C/o ED   Review of Systems  Constitutional: Negative for appetite change, fatigue and unexpected weight change.  HENT: Negative for nosebleeds, congestion, sore throat, sneezing, trouble swallowing and neck pain.   Eyes: Negative for itching and visual disturbance.  Respiratory: Negative for cough.   Cardiovascular: Negative for chest pain, palpitations and leg swelling.  Gastrointestinal: Negative for nausea, diarrhea, blood in stool and abdominal distention.  Genitourinary: Negative for frequency and hematuria.  Musculoskeletal: Positive for back pain. Negative for joint swelling and gait problem.  Skin: Negative for rash.  Neurological: Negative for dizziness, tremors, speech difficulty and weakness.  Psychiatric/Behavioral: Positive for sleep disturbance. Negative for suicidal ideas, dysphoric mood and agitation. The patient is nervous/anxious.        Objective:   Physical Exam  Constitutional: He is oriented to person, place, and time. He appears well-developed.  HENT:  Mouth/Throat: Oropharynx is clear and moist.  Eyes: Conjunctivae are normal. Pupils are equal, round, and reactive to light.  Neck: Normal range of motion. No JVD present. No thyromegaly present.  Cardiovascular: Normal rate, regular rhythm, normal heart sounds and intact distal pulses.  Exam reveals no gallop and no friction rub.   No murmur heard. Pulmonary/Chest: Effort normal and breath sounds normal. No respiratory distress. He has no wheezes. He has no rales. He exhibits no tenderness.  Abdominal: Soft. Bowel sounds are normal. He  exhibits no distension and no mass. There is no tenderness. There is no rebound and no guarding.  Musculoskeletal: Normal range of motion. He exhibits no edema and no tenderness.  Lymphadenopathy:    He has no cervical adenopathy.  Neurological: He is alert and oriented to person, place, and time. He has normal reflexes. No cranial nerve deficit. He exhibits normal muscle tone. Coordination normal.  Skin: Skin is warm and dry. No rash noted.  Psychiatric: His behavior is normal. Judgment and thought content normal.          Assessment & Plan:

## 2011-05-19 NOTE — Assessment & Plan Note (Signed)
I'll ref his meds due to circumstances 

## 2011-05-19 NOTE — Assessment & Plan Note (Signed)
I'll ref his meds due to circumstances

## 2011-06-01 ENCOUNTER — Telehealth: Payer: Self-pay | Admitting: Internal Medicine

## 2011-06-01 MED ORDER — TRAMADOL HCL 50 MG PO TABS: 50.0000 mg | ORAL_TABLET | Freq: Two times a day (BID) | ORAL | Status: DC | PRN

## 2011-06-01 NOTE — Telephone Encounter (Signed)
Ok Thx 

## 2011-06-01 NOTE — Telephone Encounter (Signed)
Pt requesting tramedol-pain killer---Pt want to switch from vacodin to tramedol---pt ph# [phone]/cell--[facility]

## 2011-06-02 ENCOUNTER — Telehealth: Payer: Self-pay

## 2011-06-02 NOTE — Telephone Encounter (Signed)
Noted. Thx.

## 2011-06-02 NOTE — Telephone Encounter (Signed)
[facility] Triage Call Report Triage Record Num: [id number] Operator: Aundra Millet Patient Name: Shane Rich Call Date & Time: 06/01/2011 7:20:05PM Patient Phone: [phone] PCP: Sonda Primes Patient Gender: Male PCP Fax : [phone] Patient DOB: 1970-05-10 Practice Name: [facility] Reason for Call: Caller: Marguis/Patient; PCP: Sonda Primes; CB#: [phone]; Call regarding ; A rx for Tramadol was called in today for ongoing back pain b/c he has been trying to get off Vicodin - hasnt taken Vicodin in 1 week. The [facility] at [facility] [address] [phone]) is questioning Tramadol rx and put on HOLD b/c Vicodon was refilled ~ 05/17/2011. Pt has the Vicodin, but doesnt want to start it back. RN called [facility] and Greggory Stallion Tech verified that on 05/19/2011 Vicodin 10/325 mg # 90 was picked up and a new order for Tramadol 50 mg 2 tabs po BID as needed for pain # 100 was sent in today. RN called Dr Posey Rea and adv situtation that pt has Vicodin on hand, but has been off Vicodin x 1 week and doesnt want to start back and would like Tramadol filled. Dr. Posey Rea ordered to fill Tramadol rx as written and for pt to bring his Vicodin to office tomorrow. RN called [facility] and Gerilyn Nestle - RPh to fill Tramadol as written per MD, and she will stated she will dispense # 10 tablets only tonight and pt can FU with office regarding remainder of rx once he brings Vicodin to office -- RN called Dr Posey Rea and he agreed to Tramadol # 10 tabs only tonight. Protocol(s) Used: Office Note Recommended Outcome per Protocol: Information Noted and Sent to Office Reason for Outcome: Caller information to office Care Advice: ~ 06/01/2011 7:53:22PM Page 1 of 1 [id number]

## 2011-06-02 NOTE — Telephone Encounter (Signed)
Pt informed. He states the pharmacists only gave him # 10 with 29 Rf because he called the after hr call a nurse and this was the agreement that was reached since he last filled Norco on 05-19-11. Pharmacist advised him to take 1/2 of Norco and that he should not be taking both together. Please advise- how should Tramadol be dispensed?

## 2011-06-03 ENCOUNTER — Telehealth: Payer: Self-pay | Admitting: *Deleted

## 2011-06-03 NOTE — Telephone Encounter (Signed)
Can pt have # 100 at a time. He states he cant go to pharmacy every few days or weekly. Please advise. Does he need to bring Norco back?

## 2011-06-03 NOTE — Telephone Encounter (Signed)
Per AVP- Pt must bring his Norco to our office before we can give him a new Rx for Tramadol. I advised pt of this because he can not be taking the two meds together. He states he will bring Norco to our office either Thurs or Monday 06-08-11. Then, we can call [facility] and cancel current Tramadol Rx and give him a new written or eRx Tramadol.

## 2011-06-03 NOTE — Telephone Encounter (Signed)
Pt calling stating he is no longer using [facility]. He is requesting that we cancel current Tramadol Rx at [facility] and he wants to get a new written Rx for Tramadol to take to a different pharmacy. Please advise.

## 2011-06-04 NOTE — Telephone Encounter (Signed)
Bring Norco OK Tramadol #100 when he brings Norco Thx

## 2011-06-09 MED ORDER — TRAMADOL HCL 50 MG PO TABS: 50.0000 mg | ORAL_TABLET | Freq: Two times a day (BID) | ORAL | Status: DC | PRN

## 2011-06-09 NOTE — Telephone Encounter (Signed)
Pt brought Hydroco/APAP 10/325 # 18 back to our office. I called [facility] to cancel the current Rx for Tramadol. New tramadol Rx printed/signed and given to pt.

## 2011-06-19 ENCOUNTER — Telehealth: Payer: Self-pay | Admitting: *Deleted

## 2011-06-19 NOTE — Telephone Encounter (Signed)
Pt left vm requesting to increase dose of tramadol. The current dose doesn't seem to be helping as good as he thought it would. He states he has been taking 6/day. Please advise.

## 2011-06-20 NOTE — Telephone Encounter (Signed)
Take no more than 4 a day Thx

## 2011-06-22 NOTE — Telephone Encounter (Signed)
Left detailed mess on pt's cell advising of below.

## 2011-06-30 ENCOUNTER — Telehealth: Payer: Self-pay | Admitting: Internal Medicine

## 2011-06-30 DIAGNOSIS — M545 Low back pain: Secondary | ICD-10-CM

## 2011-06-30 NOTE — Telephone Encounter (Signed)
TREMADOL IS NOT WORKING.  HE WANTS SOMETHING STRONGER.

## 2011-07-01 NOTE — Telephone Encounter (Signed)
I can ref him to Pain clinic Thx

## 2011-07-06 ENCOUNTER — Ambulatory Visit: Payer: Self-pay | Admitting: Internal Medicine

## 2011-07-06 DIAGNOSIS — Z0289 Encounter for other administrative examinations: Secondary | ICD-10-CM

## 2011-07-07 NOTE — Telephone Encounter (Signed)
I reached the patient.  He said it is fine to refer him to the pain clinic.

## 2011-07-07 NOTE — Telephone Encounter (Signed)
Done. Thx.

## 2011-07-08 NOTE — Telephone Encounter (Signed)
Please let him know how soon he may be seen by the pain clinic.

## 2011-07-13 ENCOUNTER — Telehealth: Payer: Self-pay

## 2011-07-13 NOTE — Telephone Encounter (Signed)
Pt called requesting stronger pain medication while he waits for pain management appt to be scheduled. Please advise.

## 2011-07-13 NOTE — Telephone Encounter (Signed)
Please stay on Tramadol Thx

## 2011-07-14 NOTE — Telephone Encounter (Signed)
PT IS AWARE

## 2011-07-14 NOTE — Telephone Encounter (Signed)
Left message on machine for pt to return my call  

## 2011-08-03 ENCOUNTER — Other Ambulatory Visit: Payer: Self-pay | Admitting: Internal Medicine

## 2011-08-21 ENCOUNTER — Other Ambulatory Visit: Payer: Self-pay | Admitting: Internal Medicine

## 2011-09-09 ENCOUNTER — Other Ambulatory Visit: Payer: Self-pay | Admitting: Internal Medicine

## 2011-09-09 NOTE — Telephone Encounter (Signed)
Ok to Rf? 

## 2011-09-10 ENCOUNTER — Other Ambulatory Visit: Payer: Self-pay | Admitting: Internal Medicine

## 2011-09-11 ENCOUNTER — Other Ambulatory Visit: Payer: Self-pay | Admitting: *Deleted

## 2011-09-11 MED ORDER — TRAMADOL HCL 50 MG PO TABS: 50.0000 mg | ORAL_TABLET | Freq: Two times a day (BID) | ORAL | Status: DC

## 2011-09-11 NOTE — Telephone Encounter (Signed)
Patient is out of town and would like refill Rx  On  Tramadol called to [facility] at [place], [place], [place]. # [phone]

## 2011-09-11 NOTE — Telephone Encounter (Signed)
OK to fill this prescription with additional refills x0 Thank you!  

## 2011-09-11 NOTE — Telephone Encounter (Signed)
Refill on medication tramadol called to [facility] [place], [name]

## 2011-09-11 NOTE — Telephone Encounter (Signed)
Ok to Rf? 

## 2011-09-15 ENCOUNTER — Ambulatory Visit: Payer: Self-pay | Admitting: Internal Medicine

## 2011-09-17 ENCOUNTER — Ambulatory Visit: Payer: Self-pay | Admitting: Internal Medicine

## 2011-09-19 ENCOUNTER — Ambulatory Visit (INDEPENDENT_AMBULATORY_CARE_PROVIDER_SITE_OTHER): Payer: Self-pay | Admitting: Family Medicine

## 2011-09-19 DIAGNOSIS — M25529 Pain in unspecified elbow: Secondary | ICD-10-CM

## 2011-09-19 NOTE — Progress Notes (Signed)
  Subjective:    Patient ID: Shane Rich, male    DOB: Jun 18, 1970, 41 y.o.   MRN: [id number]  HPI  No show.  Review of Systems     Objective:   Physical Exam        Assessment & Plan:

## 2011-09-28 ENCOUNTER — Telehealth: Payer: Self-pay | Admitting: *Deleted

## 2011-09-28 MED ORDER — TRAMADOL HCL 50 MG PO TABS: 50.0000 mg | ORAL_TABLET | Freq: Two times a day (BID) | ORAL | Status: DC

## 2011-09-28 NOTE — Telephone Encounter (Signed)
OK to fill this prescription #100 with additional refills x0 Needs OV Thank you!

## 2011-09-28 NOTE — Telephone Encounter (Signed)
Rf req for Tramadol 50 mg 1-2 po bid prn. Request is for # 100.  Ok to Rf? What quantity?

## 2011-09-28 NOTE — Telephone Encounter (Signed)
Done. Will have schedulers contact pt for OV. 

## 2011-10-12 ENCOUNTER — Ambulatory Visit: Payer: Self-pay | Admitting: Internal Medicine

## 2011-10-12 DIAGNOSIS — Z0289 Encounter for other administrative examinations: Secondary | ICD-10-CM

## 2011-10-14 ENCOUNTER — Telehealth: Payer: Self-pay | Admitting: Internal Medicine

## 2011-10-14 NOTE — Telephone Encounter (Signed)
Message copied by Etheleen Sia on Wed Oct 14, 2011  1:16 PM ------      Message from: Tresa Garter      Created: Wed Oct 14, 2011 12:03 PM       Harriett Sine, pls make a tel note out of it and inform the pt that we may need to stop seeing him due to so many NS and cancellations.      Thx      ----- Message -----         From: Etheleen Sia         Sent: 10/13/2011  10:20 AM           To: Tresa Garter, MD, Etheleen Sia, #            Should we keep calling this patient?  He was a No-Show yesterday.  He also was a No-Show on Feb. 6, 2012, Dec 11, 2010,  Jan. 24, 13, April 29, and September 19, 2011 (Sat. Clinic).        He has cancelled on Jan. 18, 2013, September 15, 2011 and July 11,2013      ----- Message -----         From: Etheleen Sia         Sent: 09/28/2011  10:03 AM           To: Etheleen Sia            LM W/ WIFE TO CALL      ----- Message -----         From: Merrilyn Puma, CMA         Sent: 09/28/2011   9:03 AM           To: Remigio Eisenmenger, #            Please contact pt- He needs OV per AVP. I Rf his med one time only. No further Rf until seen.             Thanks!!

## 2011-10-14 NOTE — Telephone Encounter (Signed)
Shane Rich made an appt for Thurs. Morning at 10:45.  Do you want me to call and cancel the appt?

## 2011-10-14 NOTE — Telephone Encounter (Signed)
I'll take action if he is a NS or a cancellation on [employer]

## 2011-10-15 ENCOUNTER — Telehealth: Payer: Self-pay

## 2011-10-15 ENCOUNTER — Ambulatory Visit (INDEPENDENT_AMBULATORY_CARE_PROVIDER_SITE_OTHER): Payer: Self-pay | Admitting: Internal Medicine

## 2011-10-15 ENCOUNTER — Encounter: Payer: Self-pay | Admitting: Internal Medicine

## 2011-10-15 VITALS — BP 130/80 | HR 72 | Temp 97.0°F | Resp 16 | Wt 182.1 lb

## 2011-10-15 DIAGNOSIS — M7710 Lateral epicondylitis, unspecified elbow: Secondary | ICD-10-CM

## 2011-10-15 DIAGNOSIS — F17200 Nicotine dependence, unspecified, uncomplicated: Secondary | ICD-10-CM

## 2011-10-15 DIAGNOSIS — M545 Low back pain: Secondary | ICD-10-CM

## 2011-10-15 DIAGNOSIS — M25529 Pain in unspecified elbow: Secondary | ICD-10-CM

## 2011-10-15 MED ORDER — IBUPROFEN 600 MG PO TABS: 600.0000 mg | ORAL_TABLET | Freq: Three times a day (TID) | ORAL | Status: DC | PRN

## 2011-10-15 MED ORDER — TRAMADOL HCL 50 MG PO TABS: 50.0000 mg | ORAL_TABLET | Freq: Two times a day (BID) | ORAL | Status: DC

## 2011-10-15 MED ORDER — ALBUTEROL SULFATE HFA 108 (90 BASE) MCG/ACT IN AERS: 2.0000 | INHALATION_SPRAY | Freq: Four times a day (QID) | RESPIRATORY_TRACT | Status: DC | PRN

## 2011-10-15 MED ORDER — PHENTERMINE HCL 37.5 MG PO CAPS: 37.5000 mg | ORAL_CAPSULE | ORAL | Status: DC

## 2011-10-15 MED ORDER — GABAPENTIN 100 MG PO CAPS: 100.0000 mg | ORAL_CAPSULE | Freq: Three times a day (TID) | ORAL | Status: DC

## 2011-10-15 NOTE — Telephone Encounter (Signed)
Pt called requesting a return call regarding questions about Rx received today. Left message on machine for pt to return call.

## 2011-10-15 NOTE — Assessment & Plan Note (Signed)
See rx. 

## 2011-10-15 NOTE — Assessment & Plan Note (Signed)
8/13 worse Added Gabapentin

## 2011-10-15 NOTE — Progress Notes (Signed)
   Subjective:    Patient ID: Shane Rich, male    DOB: January 19, 1971, 41 y.o.   MRN: [id number]  HPI   The patient is here to follow up on chronic depression, anxiety, chronic moderate LBP symptoms - worse. C/o wife threw his meds out the other day - he had some withdrawal over wknd. Better now. Working a lot. C/o B elbow pain x months C/o ED   Wt Readings from Last 3 Encounters:  10/15/11 182 lb 2 oz (82.611 kg)  05/19/11 174 lb (78.926 kg)  04/14/11 179 lb (81.194 kg)   BP Readings from Last 3 Encounters:  10/15/11 130/80  05/19/11 90/60  04/14/11 110/60       Review of Systems  Constitutional: Negative for appetite change, fatigue and unexpected weight change.  HENT: Negative for nosebleeds, congestion, sore throat, sneezing, trouble swallowing and neck pain.   Eyes: Negative for itching and visual disturbance.  Respiratory: Negative for cough.   Cardiovascular: Negative for palpitations and leg swelling.  Gastrointestinal: Negative for nausea, diarrhea, blood in stool and abdominal distention.  Genitourinary: Negative for frequency and hematuria.  Musculoskeletal: Positive for back pain. Negative for joint swelling and gait problem.  Skin: Negative for rash.  Neurological: Negative for dizziness, tremors, speech difficulty and weakness.  Psychiatric/Behavioral: Positive for disturbed wake/sleep cycle. Negative for suicidal ideas, dysphoric mood and agitation. The patient is nervous/anxious.        Objective:   Physical Exam  Constitutional: He is oriented to person, place, and time. He appears well-developed.  HENT:  Mouth/Throat: Oropharynx is clear and moist.  Eyes: Conjunctivae are normal. Pupils are equal, round, and reactive to light.  Neck: Normal range of motion. No JVD present. No thyromegaly present.  Cardiovascular: Normal rate, regular rhythm, normal heart sounds and intact distal pulses.  Exam reveals no gallop and no friction rub.   No murmur  heard. Pulmonary/Chest: Effort normal and breath sounds normal. No respiratory distress. He has no wheezes. He has no rales. He exhibits no tenderness.  Abdominal: Soft. Bowel sounds are normal. He exhibits no distension and no mass. There is no tenderness. There is no rebound and no guarding.  Musculoskeletal: Normal range of motion. He exhibits no edema and no tenderness.  Lymphadenopathy:    He has no cervical adenopathy.  Neurological: He is alert and oriented to person, place, and time. He has normal reflexes. No cranial nerve deficit. He exhibits normal muscle tone. Coordination normal.  Skin: Skin is warm and dry. No rash noted.  Psychiatric: His behavior is normal. Judgment and thought content normal.    Lab Results  Component Value Date   GLUCOSE 105* 06/04/2006   ALT 56* 06/04/2006   AST 30 06/04/2006   NA 141 06/04/2006   K 4.3 06/04/2006   CL 107 06/04/2006   CREATININE 0.9 06/04/2006   BUN 9 06/04/2006   CO2 29 06/04/2006   TSH 1.21 06/04/2006   HGBA1C 5.9 06/04/2006         Assessment & Plan:

## 2011-10-15 NOTE — Assessment & Plan Note (Signed)
Continue with current prescription therapy as reflected on the Med list.  

## 2011-10-15 NOTE — Assessment & Plan Note (Signed)
Rx options discussed 

## 2011-10-16 NOTE — Telephone Encounter (Signed)
Left mess for patient to call back.  

## 2011-10-20 NOTE — Telephone Encounter (Signed)
Called patient LMOVM, closing phone note due to multiple attempts to reach pt.

## 2011-10-23 ENCOUNTER — Telehealth: Payer: Self-pay | Admitting: Internal Medicine

## 2011-10-23 NOTE — Telephone Encounter (Signed)
Pt called returning nurses multiple tries to call him.  He wanted an appointment right away, on the Saturday clinic for back pain.  He states the medicine is not working and that he does not have any faith in Dr. Posey Rea treating him.  He left a VM on Lou's phone to complain.

## 2011-10-24 ENCOUNTER — Ambulatory Visit (INDEPENDENT_AMBULATORY_CARE_PROVIDER_SITE_OTHER): Payer: Self-pay | Admitting: Family Medicine

## 2011-10-24 ENCOUNTER — Encounter: Payer: Self-pay | Admitting: Family Medicine

## 2011-10-24 VITALS — BP 120/84 | HR 68 | Temp 97.7°F | Wt 184.0 lb

## 2011-10-24 DIAGNOSIS — M545 Low back pain: Secondary | ICD-10-CM

## 2011-10-24 DIAGNOSIS — J069 Acute upper respiratory infection, unspecified: Secondary | ICD-10-CM

## 2011-10-24 MED ORDER — MELOXICAM 7.5 MG PO TABS: 7.5000 mg | ORAL_TABLET | Freq: Two times a day (BID) | ORAL | Status: DC

## 2011-10-24 MED ORDER — TRAMADOL HCL 50 MG PO TABS: 50.0000 mg | ORAL_TABLET | Freq: Three times a day (TID) | ORAL | Status: DC | PRN

## 2011-10-24 MED ORDER — DULOXETINE HCL 30 MG PO CPEP: 30.0000 mg | ORAL_CAPSULE | Freq: Every day | ORAL | Status: DC

## 2011-10-24 NOTE — Telephone Encounter (Signed)
I'm sorry. He should start seeing a different MD; he has had multiple NS and cancellations with me. I suggested Pain Clinic consult in the past. Truddie Hidden, please discuss this issue w/me. Thx!

## 2011-10-24 NOTE — Progress Notes (Signed)
Subjective:    Patient ID: Shane Rich, male    DOB: [birth year], 41 y.o.   MRN: [id number]  HPI Hx of chronic back pain. Was on vicodon. Switched to tramadol a few months ago.  Recnelty seen for worsening back pain and increased his gabapentin. Was working yesterday and had a back spasm and couldn't finish the job.  He was lifting an air conditioning unit.  Says feels dizzy on the gabapentin. Says decreased alertness.  Tramadol helps some when pain is more mild.  Has used muscle relaxers in the past but says they don't really do much.  Using IBU and Aleve. Uses them regularly.  Tries to do some home stretches.  No radiation into legs today.    Cough and sneezingt for 2-3 days. No fever. + smoker. Dry.  No ST.  Some mild nasal congestion.  + sick contact. No cold meds.  No HA or facial pain.   Review of Systems \ BP 120/84  Pulse 68  Temp 97.7 F (36.5 C) (Oral)  Wt 184 lb (83.462 kg)    Allergies  Allergen Reactions  . Hydrocodone-Acetaminophen     REACTION: nausea on empty stomach  . Naproxen     REACTION: chest hurts  . Propoxyphene-Acetaminophen     REACTION: nausea    Past Medical History  Diagnosis Date  . GERD (gastroesophageal reflux disease)   . Anxiety   . COPD (chronic obstructive pulmonary disease)   . Chest pain, non-cardiac   . Elevated glucose   . Weight gain   . LBP (low back pain)   . Depression     [facility] Ellis Savage, NP    No past surgical history on file.  History   Social History  . Marital Status: Married    Spouse Name: N/A    Number of Children: 1  . Years of Education: N/A   Occupational History  . Not on file.   Social History Main Topics  . Smoking status: Current Everyday Smoker -- 1.0 packs/day  . Smokeless tobacco: Not on file  . Alcohol Use: No  . Drug Use: No  . Sexually Active: Yes   Other Topics Concern  . Not on file   Social History Narrative   Regular Exercise- No    Family History  Problem Relation  Age of Onset  . Anxiety disorder Other   . Diabetes Other   . Coronary artery disease Other   . Depression Mother   . Diabetes Father   . Heart disease Father   . Hyperlipidemia Father   . Hypertension Father     Outpatient Encounter Prescriptions as of 10/24/2011  Medication Sig Dispense Refill  . albuterol (PROAIR HFA) 108 (90 BASE) MCG/ACT inhaler Inhale 2 puffs into the lungs every 6 (six) hours as needed for wheezing.  1 Inhaler  6  . FLUoxetine (PROZAC) 20 MG capsule Take 1 capsule (20 mg total) by mouth daily.  90 capsule  1  . phentermine 37.5 MG capsule Take 1 capsule (37.5 mg total) by mouth every morning.  30 capsule  2  . tadalafil (CIALIS) 5 MG tablet Take 5 mg by mouth daily as needed.      . traMADol (ULTRAM) 50 MG tablet Take 1 tablet (50 mg total) by mouth every 8 (eight) hours as needed for pain.  90 tablet  1  . DISCONTD: gabapentin (NEURONTIN) 100 MG capsule Take 1 capsule (100 mg total) by mouth 3 (three) times daily.  90 capsule  3  . DISCONTD: ibuprofen (ADVIL,MOTRIN) 600 MG tablet Take 1 tablet (600 mg total) by mouth every 8 (eight) hours as needed for pain. Take twice a day x 2 weeks, then prn pain  60 tablet  3  . DISCONTD: tadalafil (CIALIS) 5 MG tablet Take 1 tablet (5 mg total) by mouth daily as needed for erectile dysfunction.  30 tablet  11  . DISCONTD: traMADol (ULTRAM) 50 MG tablet Take 1 tablet (50 mg total) by mouth 2 (two) times daily.  120 tablet  2  . DULoxetine (CYMBALTA) 30 MG capsule Take 1 capsule (30 mg total) by mouth daily.  30 capsule  1  . meloxicam (MOBIC) 7.5 MG tablet Take 1 tablet (7.5 mg total) by mouth 2 (two) times daily.  60 tablet  1          Objective:   Physical Exam  Constitutional: He is oriented to person, place, and time. He appears well-developed and well-nourished.  HENT:  Head: Normocephalic and atraumatic.  Right Ear: External ear normal.  Left Ear: External ear normal.  Nose: Nose normal.  Mouth/Throat:  Oropharynx is clear and moist.       TMs and canals are clear.   Eyes: Conjunctivae and EOM are normal. Pupils are equal, round, and reactive to light.  Neck: Neck supple. No thyromegaly present.  Cardiovascular: Normal rate and normal heart sounds.   Pulmonary/Chest: Effort normal and breath sounds normal.  Musculoskeletal:       Ecreased flexion to about 45 degrees, Normal extension. Actually feels better with extension.  Normal rotation. Dec side bending right and left. More pain to the right Nontender over the spine.    Lymphadenopathy:    He has no cervical adenopathy.  Neurological: He is alert and oriented to person, place, and time.  Skin: Skin is warm and dry.  Psychiatric: He has a normal mood and affect.          Assessment & Plan:  Chronic Low Back pain - Can increase the tramadol to TID prn but discused being flexible. Use infrequently if doing well.   Wil try meloxicam instead of Aleve and Ibuprofen. Expalined may or may not work better than Advil or Aleve but we can try it.   Will stop the gabapenin bc of dizziness.  Will try cymbalta 30mg .  F/U in one month for possible dose adjustment and to see how doing. Work on stretches. H.O given. Work on using Building [employer].  Also discussed PT.  He has never been instructed on proper lifting technique.  I really think this would be helpful. He will think about it.      URI- Likely viral. Call if not better in one week. Symptomatic care.  Quit smoking!

## 2011-10-24 NOTE — Patient Instructions (Signed)
Low Back Sprain with Rehab  A sprain is an injury in which a ligament is torn. The ligaments of the lower back are vulnerable to sprains. However, they are strong and require great force to be injured. These ligaments are important for stabilizing the spinal column. Sprains are classified into three categories. Grade 1 sprains cause pain, but the tendon is not lengthened. Grade 2 sprains include a lengthened ligament, due to the ligament being stretched or partially ruptured. With grade 2 sprains there is still function, although the function may be decreased. Grade 3 sprains involve a complete tear of the tendon or muscle, and function is usually impaired. SYMPTOMS   Severe pain in the lower back.   Sometimes, a feeling of a "pop," "snap," or tear, at the time of injury.   Tenderness and sometimes swelling at the injury site.   Uncommonly, bruising (contusion) within 48 hours of injury.   Muscle spasms in the back.  CAUSES  Low back sprains occur when a force is placed on the ligaments that is greater than they can handle. Common causes of injury include:  Performing a stressful act while off-balance.   Repetitive stressful activities that involve movement of the lower back.   Direct hit (trauma) to the lower back.  RISK INCREASES WITH:  Contact sports (football, wrestling).   Collisions (major skiing accidents).   Sports that require throwing or lifting (baseball, weightlifting).   Sports involving twisting of the spine (gymnastics, diving, tennis, golf).   Poor strength and flexibility.   Inadequate protection.   Previous back injury or surgery (especially fusion).  PREVENTION  Wear properly fitted and padded protective equipment.   Warm up and stretch properly before activity.   Allow for adequate recovery between workouts.   Maintain physical fitness:   Strength, flexibility, and endurance.   Cardiovascular fitness.   Maintain a healthy body weight.  PROGNOSIS   If treated properly, low back sprains usually heal with non-surgical treatment. The length of time for healing depends on the severity of the injury.  RELATED COMPLICATIONS   Recurring symptoms, resulting in a chronic problem.   Chronic inflammation and pain in the low back.   Delayed healing or resolution of symptoms, especially if activity is resumed too soon.   Prolonged impairment.   Unstable or arthritic joints of the low back.  TREATMENT  Treatment first involves the use of ice and medicine, to reduce pain and inflammation. The use of strengthening and stretching exercises may help reduce pain with activity. These exercises may be performed at home or with a therapist. Severe injuries may require referral to a therapist for further evaluation and treatment, such as ultrasound. Your caregiver may advise that you wear a back brace or corset, to help reduce pain and discomfort. Often, prolonged bed rest results in greater harm then benefit. Corticosteroid injections may be recommended. However, these should be reserved for the most serious cases. It is important to avoid using your back when lifting objects. At night, sleep on your back on a firm mattress, with a pillow placed under your knees. If non-surgical treatment is unsuccessful, surgery may be needed.  MEDICATION   If pain medicine is needed, nonsteroidal anti-inflammatory medicines (aspirin and ibuprofen), or other minor pain relievers (acetaminophen), are often advised.   Do not take pain medicine for 7 days before surgery.   Prescription pain relievers may be given, if your caregiver thinks they are needed. Use only as directed and only as much as you   need.   Ointments applied to the skin may be helpful.   Corticosteroid injections may be given by your caregiver. These injections should be reserved for the most serious cases, because they may only be given a certain number of times.  HEAT AND COLD  Cold treatment (icing)  should be applied for 10 to 15 minutes every 2 to 3 hours for inflammation and pain, and immediately after activity that aggravates your symptoms. Use ice packs or an ice massage.   Heat treatment may be used before performing stretching and strengthening activities prescribed by your caregiver, physical therapist, or athletic trainer. Use a heat pack or a warm water soak.  SEEK MEDICAL CARE IF:   Symptoms get worse or do not improve in 2 to 4 weeks, despite treatment.   You develop numbness or weakness in either leg.   You lose bowel or bladder function.   Any of the following occur after surgery: fever, increased pain, swelling, redness, drainage of fluids, or bleeding in the affected area.   New, unexplained symptoms develop. (Drugs used in treatment may produce side effects.)  EXERCISES  RANGE OF MOTION (ROM) AND STRETCHING EXERCISES - Low Back Sprain Most people with lower back pain will find that their symptoms get worse with excessive bending forward (flexion) or arching at the lower back (extension). The exercises that will help resolve your symptoms will focus on the opposite motion.  Your physician, physical therapist or athletic trainer will help you determine which exercises will be most helpful to resolve your lower back pain. Do not complete any exercises without first consulting with your caregiver. Discontinue any exercises which make your symptoms worse, until you speak to your caregiver. If you have pain, numbness or tingling which travels down into your buttocks, leg or foot, the goal of the therapy is for these symptoms to move closer to your back and eventually resolve. Sometimes, these leg symptoms will get better, but your lower back pain may worsen. This is often an indication of progress in your rehabilitation. Be very alert to any changes in your symptoms and the activities in which you participated in the 24 hours prior to the change. Sharing this information with your  caregiver will allow him or her to most efficiently treat your condition. These exercises may help you when beginning to rehabilitate your injury. Your symptoms may resolve with or without further involvement from your physician, physical therapist or athletic trainer. While completing these exercises, remember:   Restoring tissue flexibility helps normal motion to return to the joints. This allows healthier, less painful movement and activity.   An effective stretch should be held for at least 30 seconds.   A stretch should never be painful. You should only feel a gentle lengthening or release in the stretched tissue.  FLEXION RANGE OF MOTION AND STRETCHING EXERCISES: STRETCH - Flexion, Single Knee to Chest   Lie on a firm bed or floor with both legs extended in front of you.   Keeping one leg in contact with the floor, bring your opposite knee to your chest. Hold your leg in place by either grabbing behind your thigh or at your knee.   Pull until you feel a gentle stretch in your low back. Hold __________ seconds.   Slowly release your grasp and repeat the exercise with the opposite side.  Repeat __________ times. Complete this exercise __________ times per day.  STRETCH - Flexion, Double Knee to Chest  Lie on a firm bed or   floor with both legs extended in front of you.   Keeping one leg in contact with the floor, bring your opposite knee to your chest.   Tense your stomach muscles to support your back and then lift your other knee to your chest. Hold your legs in place by either grabbing behind your thighs or at your knees.   Pull both knees toward your chest until you feel a gentle stretch in your low back. Hold __________ seconds.   Tense your stomach muscles and slowly return one leg at a time to the floor.  Repeat __________ times. Complete this exercise __________ times per day.  STRETCH - Low Trunk Rotation  Lie on a firm bed or floor. Keeping your legs in front of you, bend  your knees so they are both pointed toward the ceiling and your feet are flat on the floor.   Extend your arms out to the side. This will stabilize your upper body by keeping your shoulders in contact with the floor.   Gently and slowly drop both knees together to one side until you feel a gentle stretch in your low back. Hold for __________ seconds.   Tense your stomach muscles to support your lower back as you bring your knees back to the starting position. Repeat the exercise to the other side.  Repeat __________ times. Complete this exercise __________ times per day  EXTENSION RANGE OF MOTION AND FLEXIBILITY EXERCISES: STRETCH - Extension, Prone on Elbows   Lie on your stomach on the floor, a bed will be too soft. Place your palms about shoulder width apart and at the height of your head.   Place your elbows under your shoulders. If this is too painful, stack pillows under your chest.   Allow your body to relax so that your hips drop lower and make contact more completely with the floor.   Hold this position for __________ seconds.   Slowly return to lying flat on the floor.  Repeat __________ times. Complete this exercise __________ times per day.  RANGE OF MOTION - Extension, Prone Press Ups  Lie on your stomach on the floor, a bed will be too soft. Place your palms about shoulder width apart and at the height of your head.   Keeping your back as relaxed as possible, slowly straighten your elbows while keeping your hips on the floor. You may adjust the placement of your hands to maximize your comfort. As you gain motion, your hands will come more underneath your shoulders.   Hold this position __________ seconds.   Slowly return to lying flat on the floor.  Repeat __________ times. Complete this exercise __________ times per day.  RANGE OF MOTION- Quadruped, Neutral Spine   Assume a hands and knees position on a firm surface. Keep your hands under your shoulders and your knees  under your hips. You may place padding under your knees for comfort.   Drop your head and point your tailbone toward the ground below you. This will round out your lower back like an angry cat. Hold this position for __________ seconds.   Slowly lift your head and release your tail bone so that your back sags into a large arch, like an old horse.   Hold this position for __________ seconds.   Repeat this until you feel limber in your low back.   Now, find your "sweet spot." This will be the most comfortable position somewhere between the two previous positions. This is your neutral spine.   Once you have found this position, tense your stomach muscles to support your low back.   Hold this position for __________ seconds.  Repeat __________ times. Complete this exercise __________ times per day.  STRENGTHENING EXERCISES - Low Back Sprain These exercises may help you when beginning to rehabilitate your injury. These exercises should be done near your "sweet spot." This is the neutral, low-back arch, somewhere between fully rounded and fully arched, that is your least painful position. When performed in this safe range of motion, these exercises can be used for people who have either a flexion or extension based injury. These exercises may resolve your symptoms with or without further involvement from your physician, physical therapist or athletic trainer. While completing these exercises, remember:   Muscles can gain both the endurance and the strength needed for everyday activities through controlled exercises.   Complete these exercises as instructed by your physician, physical therapist or athletic trainer. Increase the resistance and repetitions only as guided.   You may experience muscle soreness or fatigue, but the pain or discomfort you are trying to eliminate should never worsen during these exercises. If this pain does worsen, stop and make certain you are following the directions exactly.  If the pain is still present after adjustments, discontinue the exercise until you can discuss the trouble with your caregiver.  STRENGTHENING - Deep Abdominals, Pelvic Tilt   Lie on a firm bed or floor. Keeping your legs in front of you, bend your knees so they are both pointed toward the ceiling and your feet are flat on the floor.   Tense your lower abdominal muscles to press your low back into the floor. This motion will rotate your pelvis so that your tail bone is scooping upwards rather than pointing at your feet or into the floor.  With a gentle tension and even breathing, hold this position for __________ seconds. Repeat __________ times. Complete this exercise __________ times per day.  STRENGTHENING - Abdominals, Crunches   Lie on a firm bed or floor. Keeping your legs in front of you, bend your knees so they are both pointed toward the ceiling and your feet are flat on the floor. Cross your arms over your chest.   Slightly tip your chin down without bending your neck.   Tense your abdominals and slowly lift your trunk high enough to just clear your shoulder blades. Lifting higher can put excessive stress on the lower back and does not further strengthen your abdominal muscles.   Control your return to the starting position.  Repeat __________ times. Complete this exercise __________ times per day.  STRENGTHENING - Quadruped, Opposite UE/LE Lift   Assume a hands and knees position on a firm surface. Keep your hands under your shoulders and your knees under your hips. You may place padding under your knees for comfort.   Find your neutral spine and gently tense your abdominal muscles so that you can maintain this position. Your shoulders and hips should form a rectangle that is parallel with the floor and is not twisted.   Keeping your trunk steady, lift your right hand no higher than your shoulder and then your left leg no higher than your hip. Make sure you are not holding your  breath. Hold this position for __________ seconds.   Continuing to keep your abdominal muscles tense and your back steady, slowly return to your starting position. Repeat with the opposite arm and leg.  Repeat __________ times. Complete this exercise __________ times   per day.  STRENGTHENING - Abdominals and Quadriceps, Straight Leg Raise   Lie on a firm bed or floor with both legs extended in front of you.   Keeping one leg in contact with the floor, bend the other knee so that your foot can rest flat on the floor.   Find your neutral spine, and tense your abdominal muscles to maintain your spinal position throughout the exercise.   Slowly lift your straight leg off the floor about 6 inches for a count of 15, making sure to not hold your breath.   Still keeping your neutral spine, slowly lower your leg all the way to the floor.  Repeat this exercise with each leg __________ times. Complete this exercise __________ times per day. POSTURE AND BODY MECHANICS CONSIDERATIONS - Low Back Sprain Keeping correct posture when sitting, standing or completing your activities will reduce the stress put on different body tissues, allowing injured tissues a chance to heal and limiting painful experiences. The following are general guidelines for improved posture. Your physician or physical therapist will provide you with any instructions specific to your needs. While reading these guidelines, remember:  The exercises prescribed by your provider will help you have the flexibility and strength to maintain correct postures.   The correct posture provides the best environment for your joints to work. All of your joints have less wear and tear when properly supported by a spine with good posture. This means you will experience a healthier, less painful body.   Correct posture must be practiced with all of your activities, especially prolonged sitting and standing. Correct posture is as important when doing  repetitive low-stress activities (typing) as it is when doing a single heavy-load activity (lifting).  RESTING POSITIONS Consider which positions are most painful for you when choosing a resting position. If you have pain with flexion-based activities (sitting, bending, stooping, squatting), choose a position that allows you to rest in a less flexed posture. You would want to avoid curling into a fetal position on your side. If your pain worsens with extension-based activities (prolonged standing, working overhead), avoid resting in an extended position such as sleeping on your stomach. Most people will find more comfort when they rest with their spine in a more neutral position, neither too rounded nor too arched. Lying on a non-sagging bed on your side with a pillow between your knees, or on your back with a pillow under your knees will often provide some relief. Keep in mind, being in any one position for a prolonged period of time, no matter how correct your posture, can still lead to stiffness. PROPER SITTING POSTURE In order to minimize stress and discomfort on your spine, you must sit with correct posture. Sitting with good posture should be effortless for a healthy body. Returning to good posture is a gradual process. Many people can work toward this most comfortably by using various supports until they have the flexibility and strength to maintain this posture on their own. When sitting with proper posture, your ears will fall over your shoulders and your shoulders will fall over your hips. You should use the back of the chair to support your upper back. Your lower back will be in a neutral position, just slightly arched. You may place a small pillow or folded towel at the base of your lower back for  support.  When working at a desk, create an environment that supports good, upright posture. Without extra support, muscles tire, which leads to excessive   strain on joints and other tissues. Keep these  recommendations in mind: CHAIR:  A chair should be able to slide under your desk when your back makes contact with the back of the chair. This allows you to work closely.   The chair's height should allow your eyes to be level with the upper part of your monitor and your hands to be slightly lower than your elbows.  BODY POSITION  Your feet should make contact with the floor. If this is not possible, use a foot rest.   Keep your ears over your shoulders. This will reduce stress on your neck and low back.  INCORRECT SITTING POSTURES  If you are feeling tired and unable to assume a healthy sitting posture, do not slouch or slump. This puts excessive strain on your back tissues, causing more damage and pain. Healthier options include:  Using more support, like a lumbar pillow.   Switching tasks to something that requires you to be upright or walking.   Talking a brief walk.   Lying down to rest in a neutral-spine position.  PROLONGED STANDING WHILE SLIGHTLY LEANING FORWARD  When completing a task that requires you to lean forward while standing in one place for a long time, place either foot up on a stationary 2-4 inch high object to help maintain the best posture. When both feet are on the ground, the lower back tends to lose its slight inward curve. If this curve flattens (or becomes too large), then the back and your other joints will experience too much stress, tire more quickly, and can cause pain. CORRECT STANDING POSTURES Proper standing posture should be assumed with all daily activities, even if they only take a few moments, like when brushing your teeth. As in sitting, your ears should fall over your shoulders and your shoulders should fall over your hips. You should keep a slight tension in your abdominal muscles to brace your spine. Your tailbone should point down to the ground, not behind your body, resulting in an over-extended swayback posture.  INCORRECT STANDING POSTURES    Common incorrect standing postures include a forward head, locked knees and/or an excessive swayback. WALKING Walk with an upright posture. Your ears, shoulders and hips should all line-up. PROLONGED ACTIVITY IN A FLEXED POSITION When completing a task that requires you to bend forward at your waist or lean over a low surface, try to find a way to stabilize 3 out of 4 of your limbs. You can place a hand or elbow on your thigh or rest a knee on the surface you are reaching across. This will provide you more stability, so that your muscles do not tire as quickly. By keeping your knees relaxed, or slightly bent, you will also reduce stress across your lower back. CORRECT LIFTING TECHNIQUES DO :  Assume a wide stance. This will provide you more stability and the opportunity to get as close as possible to the object which you are lifting.   Tense your abdominals to brace your spine. Bend at the knees and hips. Keeping your back locked in a neutral-spine position, lift using your leg muscles. Lift with your legs, keeping your back straight.   Test the weight of unknown objects before attempting to lift them.   Try to keep your elbows locked down at your sides in order get the best strength from your shoulders when carrying an object.   Always ask for help when lifting heavy or awkward objects.  INCORRECT LIFTING TECHNIQUES DO   NOT:   Lock your knees when lifting, even if it is a small object.   Bend and twist. Pivot at your feet or move your feet when needing to change directions.   Assume that you can safely pick up even a paperclip without proper posture.  Document Released: 02/23/2005 Document Revised: 02/12/2011 Document Reviewed: 06/07/2008 [employer] Patient Information 2012 [employer]. 

## 2011-10-29 ENCOUNTER — Telehealth: Payer: Self-pay | Admitting: Internal Medicine

## 2011-10-29 NOTE — Telephone Encounter (Signed)
Caller: [name]/Patient; Patient Name: Shane Rich; PCP: Sonda Primes; Best Callback Phone Number: [phone].  Call regarding Elbow and Back Pain, not relieved by Tramadol or Meloxicam prescribed on 8-17. Patient wanting to know if there is something else to take that's better than Tramadol but not narcotic if possible.   Tyelnol, ibuprofen doesn't help. Patient works on A/C units, lots of bending and lifting. All emergent symptoms ruled out per Back Symptoms protocol.  Appointment scheduled within 24 hours on 8-23.  Patient verbalized understanding.  Discussed massage therapy, heat to help chronic back pain.

## 2011-10-30 ENCOUNTER — Encounter: Payer: Self-pay | Admitting: Internal Medicine

## 2011-10-30 ENCOUNTER — Ambulatory Visit (INDEPENDENT_AMBULATORY_CARE_PROVIDER_SITE_OTHER): Payer: Self-pay | Admitting: Internal Medicine

## 2011-10-30 VITALS — BP 122/80 | HR 87 | Temp 98.2°F | Ht 69.0 in | Wt 184.8 lb

## 2011-10-30 DIAGNOSIS — M545 Low back pain, unspecified: Secondary | ICD-10-CM

## 2011-10-30 DIAGNOSIS — F411 Generalized anxiety disorder: Secondary | ICD-10-CM

## 2011-10-30 DIAGNOSIS — M7710 Lateral epicondylitis, unspecified elbow: Secondary | ICD-10-CM

## 2011-10-30 MED ORDER — PREDNISONE 10 MG PO TABS: 10.0000 mg | ORAL_TABLET | Freq: Every day | ORAL | Status: DC

## 2011-10-30 MED ORDER — TRAMADOL HCL 50 MG PO TABS: 100.0000 mg | ORAL_TABLET | Freq: Four times a day (QID) | ORAL | Status: DC | PRN

## 2011-10-30 MED ORDER — CYCLOBENZAPRINE HCL 5 MG PO TABS: 5.0000 mg | ORAL_TABLET | Freq: Three times a day (TID) | ORAL | Status: DC | PRN

## 2011-10-30 MED ORDER — MELOXICAM 7.5 MG PO TABS: 7.5000 mg | ORAL_TABLET | Freq: Two times a day (BID) | ORAL | Status: AC

## 2011-10-30 MED ORDER — CYCLOBENZAPRINE HCL 5 MG PO TABS: 5.0000 mg | ORAL_TABLET | Freq: Three times a day (TID) | ORAL | Status: AC | PRN

## 2011-10-30 MED ORDER — MELOXICAM 7.5 MG PO TABS: 7.5000 mg | ORAL_TABLET | Freq: Two times a day (BID) | ORAL | Status: DC

## 2011-10-30 NOTE — Patient Instructions (Addendum)
Take all new medications as prescribed - the increased tramadol, flexeril for muscle relaxer, and prednisone for the tendonitis Continue all other medications as before, including the refill of the mobic Please also wear the arm band to each forearm while working to take the pressure of the elbows and help with the tendonitis Please call if you would like referral to orthopedic

## 2011-10-30 NOTE — Assessment & Plan Note (Signed)
stable overall by hx and exam, and pt to continue medical treatment as before 

## 2011-10-30 NOTE — Assessment & Plan Note (Signed)
Etiology unclear, neuro exam ok but by exam likely has element of at least chronic low lumbar spine strain and diffuse MSK spasm bilat including most of the back;  Current tramadol alone not working, could not afford cymbalta (and already on prozac);  Need to avoid vicodin or high scheduled meds due to hx of dependence;  For increased tramadol  - 2 q 6 hrs prn, add flexeril prn, cont nsaid prn,  to f/u any worsening symptoms or concerns; ideally should have imaging at some point but declines due to lack of insurance

## 2011-10-30 NOTE — Progress Notes (Signed)
Subjective:    Patient ID: Shane Rich, male    DOB: 1970-09-05, 41 y.o.   MRN: [id number]  HPI  Here to f/u, pt of Dr Posey Rea with chronic LBP, tramadol at 50 mgtid prn not working, and the cymbalta too expensive.  Has had ongoing LBP for several yrs, does installing Heat and AC, does quite a bit of crawling, lifting, installing heavy objects.  Located mid low back , radiates towards the middle and upper back, but no bowel or bladder change, fever, wt loss,  worsening LE pain/numbness/weakness, gait change or falls.  Also with bilat elbow pain with swelling at the lateral epicondyles, icy hot helps some.  No insurance at this time.  No recent MRI for the back, and no xrays on chart.  Admits to narcotic dependence in the past with difficutly weaning off, wants to avoid vicodin. Past Medical History  Diagnosis Date  . GERD (gastroesophageal reflux disease)   . Anxiety   . COPD (chronic obstructive pulmonary disease)   . Chest pain, non-cardiac   . Elevated glucose   . Weight gain   . LBP (low back pain)   . Depression     [facility] Ellis Savage, NP   No past surgical history on file.  reports that he has been smoking.  He does not have any smokeless tobacco history on file. He reports that he does not drink alcohol or use illicit drugs. family history includes Anxiety disorder in his other; Coronary artery disease in his other; Depression in his mother; Diabetes in his father and other; Heart disease in his father; Hyperlipidemia in his father; and Hypertension in his father. Allergies  Allergen Reactions  . Hydrocodone-Acetaminophen     REACTION: nausea on empty stomach  . Naproxen     REACTION: chest hurts  . Propoxyphene-Acetaminophen     REACTION: nausea   Current Outpatient Prescriptions on File Prior to Visit  Medication Sig Dispense Refill  . albuterol (PROAIR HFA) 108 (90 BASE) MCG/ACT inhaler Inhale 2 puffs into the lungs every 6 (six) hours as needed for wheezing.   1 Inhaler  6  . FLUoxetine (PROZAC) 20 MG capsule Take 1 capsule (20 mg total) by mouth daily.  90 capsule  1  . phentermine 37.5 MG capsule Take 1 capsule (37.5 mg total) by mouth every morning.  30 capsule  2  . tadalafil (CIALIS) 5 MG tablet Take 5 mg by mouth daily as needed.      Marland Kitchen DISCONTD: traMADol (ULTRAM) 50 MG tablet Take 1 tablet (50 mg total) by mouth every 8 (eight) hours as needed for pain.  90 tablet  1   Review of Systems  Eyes: Negative for photophobia and visual disturbance.  Respiratory: Negative for choking and stridor.   Gastrointestinal: Negative for vomiting and blood in stool.  Genitourinary: Negative for hematuria and decreased urine volume.  Musculoskeletal: Negative for gait problem.  Skin: Negative for color change and wound.  Neurological: Negative for tremors and numbness.  Psychiatric/Behavioral: Negative for decreased concentration. The patient is not hyperactive.      Objective:   Physical Exam BP 122/80  Pulse 87  Temp 98.2 F (36.8 C) (Oral)  Ht 5\' 9"  (1.753 m)  Wt 184 lb 12 oz (83.802 kg)  BMI 27.28 kg/m2  SpO2 97% Physical Exam  VS noted Constitutional: Pt appears well-developed and well-nourished.  HENT: Head: Normocephalic.  Right Ear: External ear normal.  Left Ear: External ear normal.  Eyes: Conjunctivae and  EOM are normal. Pupils are equal, round, and reactive to light.  Neck: Normal range of motion. Neck supple.  Cardiovascular: Normal rate and regular rhythm.   Pulmonary/Chest: Effort normal and breath sounds normal.  Spine:  Diffuse tender bilat trapezoid, parathoracic, and lumbar paravertebral as well with spasm, also mild low lumbar midline tender, all without swelling, rash Neurological: Pt is alert. Not confused, motor/dtr/sens intact, gait intact Skin: Skin is warm. No erythema.  Psychiatric: Pt behavior is normal. Thought content normal. 1+ nervous    Assessment & Plan:

## 2011-10-30 NOTE — Assessment & Plan Note (Addendum)
bilat today, Ok for predpack asd, needs to slow up on using the arms for work if possible, also wear arm bands to forearms;  I advised referral to ortho but he declines due to cost

## 2011-12-14 ENCOUNTER — Other Ambulatory Visit: Payer: Self-pay | Admitting: Internal Medicine

## 2011-12-15 NOTE — Telephone Encounter (Signed)
Ok to Rf? 

## 2011-12-23 ENCOUNTER — Ambulatory Visit: Payer: Self-pay | Admitting: Internal Medicine

## 2012-01-08 ENCOUNTER — Other Ambulatory Visit: Payer: Self-pay | Admitting: Internal Medicine

## 2012-01-11 NOTE — Telephone Encounter (Signed)
Ok to RF? 

## 2012-01-12 ENCOUNTER — Other Ambulatory Visit: Payer: Self-pay

## 2012-01-12 DIAGNOSIS — M545 Low back pain: Secondary | ICD-10-CM

## 2012-01-12 MED ORDER — TRAMADOL HCL 50 MG PO TABS: 100.0000 mg | ORAL_TABLET | Freq: Four times a day (QID) | ORAL | Status: DC | PRN

## 2012-01-12 NOTE — Telephone Encounter (Signed)
Done hardcopy to robin  

## 2012-01-12 NOTE — Telephone Encounter (Signed)
Faxed hardcopy to pharmacy. 

## 2012-01-15 ENCOUNTER — Ambulatory Visit: Payer: Self-pay | Admitting: Internal Medicine

## 2012-01-15 DIAGNOSIS — Z0289 Encounter for other administrative examinations: Secondary | ICD-10-CM

## 2012-01-18 ENCOUNTER — Other Ambulatory Visit: Payer: Self-pay | Admitting: Internal Medicine

## 2012-01-18 ENCOUNTER — Telehealth: Payer: Self-pay | Admitting: *Deleted

## 2012-01-18 NOTE — Telephone Encounter (Signed)
Declined  The pt should start seeing a different doctor in this practice or switch to a different practice all together. If I d/c him from practice formally - he will have to leave [facility]. Please let me know...  10/23/11 tel note: "he (the pt) does not have any faith in Dr. Posey Rea treating him. He left a VM on Lou's phone to complain".   Thx

## 2012-01-18 NOTE — Telephone Encounter (Signed)
Rf req for Alprazolam 2 mg 1 po tid. Please advise ok to Rf?

## 2012-01-19 NOTE — Telephone Encounter (Signed)
Left mess for patient to call back re: below.

## 2012-01-25 NOTE — Telephone Encounter (Signed)
Closing phone note- multiple unsuccessful attempts to contact pt.

## 2012-02-24 ENCOUNTER — Other Ambulatory Visit: Payer: Self-pay

## 2012-02-24 ENCOUNTER — Telehealth: Payer: Self-pay | Admitting: *Deleted

## 2012-02-24 DIAGNOSIS — M545 Low back pain, unspecified: Secondary | ICD-10-CM

## 2012-02-24 MED ORDER — TRAMADOL HCL 50 MG PO TABS: 100.0000 mg | ORAL_TABLET | Freq: Four times a day (QID) | ORAL | Status: DC | PRN

## 2012-02-24 NOTE — Telephone Encounter (Signed)
Rf req for Tramadol 50 mg. Per Dr. Posey Rea- he is no longer filling meds for pt as Mr. Litsey expressed dissatisfaction with his care that he was receiving in 10/2011. I informed pharmacy and forwarded Rf req to Zella Ball, CMA for Dr. Jonny Ruiz.

## 2012-02-24 NOTE — Telephone Encounter (Signed)
Sorry, I cannot accept as new pt as my "panel" is full  I did do 30 day tramadol, but I cannot do more than this in the future

## 2012-02-24 NOTE — Telephone Encounter (Signed)
A user error has taken place: encounter opened in error, closed for administrative reasons.

## 2012-02-24 NOTE — Telephone Encounter (Signed)
The patient was under the impression (at his last OV with JWJ) that Dr. Jonny Ruiz was going to be his new PCP if not could you give the patient a 30 day supply of Tramadol.

## 2012-02-24 NOTE — Telephone Encounter (Signed)
Patient is requesting Tramadol refill 50 mg.  PCP for this patient will not fill this medication.  The patient is out and needs refill please advise

## 2012-02-25 NOTE — Telephone Encounter (Signed)
Patient informed and faxed hardcopy to [facility] [address] [place]

## 2012-05-08 ENCOUNTER — Ambulatory Visit: Payer: Self-pay | Admitting: Internal Medicine

## 2012-05-08 VITALS — BP 133/83 | HR 85 | Temp 98.1°F | Resp 18 | Ht 69.0 in | Wt 193.0 lb

## 2012-05-08 DIAGNOSIS — R197 Diarrhea, unspecified: Secondary | ICD-10-CM

## 2012-05-08 DIAGNOSIS — A049 Bacterial intestinal infection, unspecified: Secondary | ICD-10-CM

## 2012-05-08 LAB — POCT UA - MICROSCOPIC ONLY: Crystals, Ur, HPF, POC: NEGATIVE

## 2012-05-08 LAB — POCT URINALYSIS DIPSTICK
Glucose, UA: NEGATIVE
Ketones, UA: NEGATIVE
Leukocytes, UA: NEGATIVE
Protein, UA: NEGATIVE
Urobilinogen, UA: 0.2

## 2012-05-08 LAB — POCT CBC
Granulocyte percent: 70.4 %G (ref 37–80)
MCV: 89.3 fL (ref 80–97)
MID (cbc): 0.8 (ref 0–0.9)
MPV: 7.3 fL (ref 0–99.8)
POC Granulocyte: 10.1 — AB (ref 2–6.9)
POC LYMPH PERCENT: 24.1 %L (ref 10–50)
POC MID %: 5.5 %M (ref 0–12)
Platelet Count, POC: 417 10*3/uL (ref 142–424)
RBC: 5.35 M/uL (ref 4.69–6.13)
RDW, POC: 13.6 %

## 2012-05-08 MED ORDER — CIPROFLOXACIN HCL 500 MG PO TABS: 500.0000 mg | ORAL_TABLET | Freq: Two times a day (BID) | ORAL | Status: DC

## 2012-05-08 NOTE — Progress Notes (Signed)
  Subjective:    Patient ID: Shane Rich, male    DOB: 12/17/1970, 42 y.o.   MRN: [id number]  HPI Sudden onset today of nausea, abdominal ache, and loose stools. No fever yet, pain not severe. 4-5 watery brown stools, no exposure hx.   Review of Systems See Dr. Melvyn Novas problem list    Objective:   Physical Exam  Vitals reviewed. Constitutional: He is oriented to person, place, and time. He appears well-developed and well-nourished. No distress.  HENT:  Mouth/Throat: Oropharynx is clear and moist.  Eyes: EOM are normal. No scleral icterus.  Neck: Normal range of motion.  Cardiovascular: Normal rate, regular rhythm and normal heart sounds.   Pulmonary/Chest: Effort normal and breath sounds normal.  Abdominal: Soft. He exhibits no distension and no mass. There is tenderness. There is no rebound and no guarding.  Musculoskeletal: Normal range of motion.  Neurological: He is alert and oriented to person, place, and time. He exhibits normal muscle tone. Coordination normal.  Skin: Skin is warm and dry.  Psychiatric: He has a normal mood and affect.   Results for orders placed in visit on 05/08/12  POCT CBC      Result Value Range   WBC 14.4 (*) 4.6 - 10.2 K/uL   Lymph, poc 3.5 (*) 0.6 - 3.4   POC LYMPH PERCENT 24.1  10 - 50 %L   MID (cbc) 0.8  0 - 0.9   POC MID % 5.5  0 - 12 %M   POC Granulocyte 10.1 (*) 2 - 6.9   Granulocyte percent 70.4  37 - 80 %G   RBC 5.35  4.69 - 6.13 M/uL   Hemoglobin 15.9  14.1 - 18.1 g/dL   HCT, POC 78.2  95.6 - 53.7 %   MCV 89.3  80 - 97 fL   MCH, POC 29.7  27 - 31.2 pg   MCHC 33.3  31.8 - 35.4 g/dL   RDW, POC 21.3     Platelet Count, POC 417  142 - 424 K/uL   MPV 7.3  0 - 99.8 fL  POCT UA - MICROSCOPIC ONLY      Result Value Range   WBC, Ur, HPF, POC neg     RBC, urine, microscopic 3-6     Bacteria, U Microscopic neg     Mucus, UA neg     Epithelial cells, urine per micros neg     Crystals, Ur, HPF, POC neg     Casts, Ur, LPF, POC neg     Yeast, UA neg    POCT URINALYSIS DIPSTICK      Result Value Range   Color, UA yellow     Clarity, UA clear     Glucose, UA neg     Bilirubin, UA neg     Ketones, UA neg     Spec Grav, UA <=1.005     Blood, UA large     pH, UA 6.0     Protein, UA neg     Urobilinogen, UA 0.2     Nitrite, UA neg     Leukocytes, UA Negative            Assessment & Plan:  Gastroenteritis bacterial likely Clear liquid diet/Cipro 500mg  bid

## 2012-05-08 NOTE — Progress Notes (Signed)
  Subjective:    Patient ID: Shane Rich, male    DOB: [birth year], 42 y.o.   MRN: [id number]  HPI    Review of Systems     Objective:   Physical Exam        Assessment & Plan:  Bacterial gastroenteritis Cipro and diet

## 2012-05-08 NOTE — Addendum Note (Signed)
Addended by: Jonita Albee on: 05/08/2012 07:17 PM   Modules accepted: Orders

## 2012-05-08 NOTE — Patient Instructions (Addendum)
B.R.A.T. Diet Your doctor has recommended the B.R.A.T. diet for you or your child until the condition improves. This is often used to help control diarrhea and vomiting symptoms. If you or your child can tolerate clear liquids, you may have:  Bananas.   Rice.   Applesauce.   Toast (and other simple starches such as crackers, potatoes, noodles).  Be sure to avoid dairy products, meats, and fatty foods until symptoms are better. Fruit juices such as apple, grape, and prune juice can make diarrhea worse. Avoid these. Continue this diet for 2 days or as instructed by your caregiver. Document Released: 02/23/2005 Document Revised: 02/12/2011 Document Reviewed: 08/12/2006 [facility] Patient Information 2012 [place], [place].Viral Gastroenteritis Viral gastroenteritis is also known as stomach flu. This condition affects the stomach and intestinal tract. It can cause sudden diarrhea and vomiting. The illness typically lasts 3 to 8 days. Most people develop an immune response that eventually gets rid of the virus. While this natural response develops, the virus can make you quite ill. CAUSES  Many different viruses can cause gastroenteritis, such as rotavirus or noroviruses. You can catch one of these viruses by consuming contaminated food or water. You may also catch a virus by sharing utensils or other personal items with an infected person or by touching a contaminated surface. SYMPTOMS  The most common symptoms are diarrhea and vomiting. These problems can cause a severe loss of body fluids (dehydration) and a body salt (electrolyte) imbalance. Other symptoms may include:  Fever.  Headache.  Fatigue.  Abdominal pain. DIAGNOSIS  Your caregiver can usually diagnose viral gastroenteritis based on your symptoms and a physical exam. A stool sample may also be taken to test for the presence of viruses or other infections. TREATMENT  This illness typically goes away on its own. Treatments are aimed at  rehydration. The most serious cases of viral gastroenteritis involve vomiting so severely that you are not able to keep fluids down. In these cases, fluids must be given through an intravenous line (IV). HOME CARE INSTRUCTIONS   Drink enough fluids to keep your urine clear or pale yellow. Drink small amounts of fluids frequently and increase the amounts as tolerated.  Ask your caregiver for specific rehydration instructions.  Avoid:  Foods high in sugar.  Alcohol.  Carbonated drinks.  Tobacco.  Juice.  Caffeine drinks.  Extremely hot or cold fluids.  Fatty, greasy foods.  Too much intake of anything at one time.  Dairy products until 24 to 48 hours after diarrhea stops.  You may consume probiotics. Probiotics are active cultures of beneficial bacteria. They may lessen the amount and number of diarrheal stools in adults. Probiotics can be found in yogurt with active cultures and in supplements.  Wash your hands well to avoid spreading the virus.  Only take over-the-counter or prescription medicines for pain, discomfort, or fever as directed by your caregiver. Do not give aspirin to children. Antidiarrheal medicines are not recommended.  Ask your caregiver if you should continue to take your regular prescribed and over-the-counter medicines.  Keep all follow-up appointments as directed by your caregiver. SEEK IMMEDIATE MEDICAL CARE IF:   You are unable to keep fluids down.  You do not urinate at least once every 6 to 8 hours.  You develop shortness of breath.  You notice blood in your stool or vomit. This may look like coffee grounds.  You have abdominal pain that increases or is concentrated in one small area (localized).  You have persistent vomiting or  diarrhea.  You have a fever.  The patient is a child younger than 3 months, and he or she has a fever.  The patient is a child older than 3 months, and he or she has a fever and persistent symptoms.  The  patient is a child older than 3 months, and he or she has a fever and symptoms suddenly get worse.  The patient is a baby, and he or she has no tears when crying. MAKE SURE YOU:   Understand these instructions.  Will watch your condition.  Will get help right away if you are not doing well or get worse. Document Released: 02/23/2005 Document Revised: 05/18/2011 Document Reviewed: 12/10/2010 [facility] Patient Information 2013 [place], [place]. Diarrhea Infections caused by germs (bacterial) or a virus commonly cause diarrhea. Your caregiver has determined that with time, rest and fluids, the diarrhea should improve. In general, eat normally while drinking more water than usual. Although water may prevent dehydration, it does not contain salt and minerals (electrolytes). Broths, weak tea without caffeine and oral rehydration solutions (ORS) replace fluids and electrolytes. Small amounts of fluids should be taken frequently. Large amounts at one time may not be tolerated. Plain water may be harmful in infants and the elderly. Oral rehydrating solutions (ORS) are available at pharmacies and grocery stores. ORS replace water and important electrolytes in proper proportions. Sports drinks are not as effective as ORS and may be harmful due to sugars worsening diarrhea.  ORS is especially recommended for use in children with diarrhea. As a general guideline for children, replace any new fluid losses from diarrhea and/or vomiting with ORS as follows:  If your child weighs 22 pounds or under (10 kg or less), give 60-120 mL ( -  cup or 2 - 4 ounces) of ORS for each episode of diarrheal stool or vomiting episode.  If your child weighs more than 22 pounds (more than 10 kgs), give 120-240 mL ( - 1 cup or 4 - 8 ounces) of ORS for each diarrheal stool or episode of vomiting.  While correcting for dehydration, children should eat normally. However, foods high in sugar should be avoided because this may worsen  diarrhea. Large amounts of carbonated soft drinks, juice, gelatin desserts and other highly sugared drinks should be avoided.  After correction of dehydration, other liquids that are appealing to the child may be added. Children should drink small amounts of fluids frequently and fluids should be increased as tolerated. Children should drink enough fluids to keep urine clear or pale yellow.  Adults should eat normally while drinking more fluids than usual. Drink small amounts of fluids frequently and increase as tolerated. Drink enough fluids to keep urine clear or pale yellow. Broths, weak decaffeinated tea, lemon lime soft drinks (allowed to go flat) and ORS replace fluids and electrolytes.  Avoid:  Carbonated drinks.  Juice.  Extremely hot or cold fluids.  Caffeine drinks.  Fatty, greasy foods.  Alcohol.  Tobacco.  Too much intake of anything at one time.  Gelatin desserts.  Probiotics are active cultures of beneficial bacteria. They may lessen the amount and number of diarrheal stools in adults. Probiotics can be found in yogurt with active cultures and in supplements.  Wash hands well to avoid spreading bacteria and virus.  Anti-diarrheal medications are not recommended for infants and children.  Only take over-the-counter or prescription medicines for pain, discomfort or fever as directed by your caregiver. Do not give aspirin to children because it may cause Reye's  Syndrome.  For adults, ask your caregiver if you should continue all prescribed and over-the-counter medicines.  If your caregiver has given you a follow-up appointment, it is very important to keep that appointment. Not keeping the appointment could result in a chronic or permanent injury, and disability. If there is any problem keeping the appointment, you must call back to this facility for assistance. SEEK IMMEDIATE MEDICAL CARE IF:   You or your child is unable to keep fluids down or other symptoms or  problems become worse in spite of treatment.  Vomiting or diarrhea develops and becomes persistent.  There is vomiting of blood or bile (green material).  There is blood in the stool or the stools are black and tarry.  There is no urine output in 6-8 hours or there is only a small amount of very dark urine.  Abdominal pain develops, increases or localizes.  You have a fever.  Your baby is older than 3 months with a rectal temperature of 102 F (38.9 C) or higher.  Your baby is 66 months old or younger with a rectal temperature of 100.4 F (38 C) or higher.  You or your child develops excessive weakness, dizziness, fainting or extreme thirst.  You or your child develops a rash, stiff neck, severe headache or become irritable or sleepy and difficult to awaken. MAKE SURE YOU:   Understand these instructions.  Will watch your condition.  Will get help right away if you are not doing well or get worse. Document Released: 02/13/2002 Document Revised: 05/18/2011 Document Reviewed: 12/31/2008 [facility] Patient Information 2013 [place], [place].

## 2012-05-08 NOTE — Progress Notes (Signed)
  Subjective:    Patient ID: Shane Rich, male    DOB: January 26, 1971, 42 y.o.   MRN: [id number]  HPI    Review of Systems     Objective:   Physical Exam        Assessment & Plan:

## 2012-05-19 ENCOUNTER — Other Ambulatory Visit: Payer: Self-pay | Admitting: Internal Medicine

## 2012-07-06 ENCOUNTER — Other Ambulatory Visit: Payer: Self-pay | Admitting: Internal Medicine

## 2012-07-07 NOTE — Telephone Encounter (Signed)
RTC

## 2012-07-27 NOTE — Progress Notes (Signed)
This encounter was created in error - please disregard.

## 2014-08-10 ENCOUNTER — Ambulatory Visit: Payer: Self-pay | Admitting: Internal Medicine

## 2014-08-10 DIAGNOSIS — Z0289 Encounter for other administrative examinations: Secondary | ICD-10-CM

## 2014-08-20 ENCOUNTER — Encounter: Payer: Self-pay | Admitting: Internal Medicine

## 2014-08-20 DIAGNOSIS — Z0289 Encounter for other administrative examinations: Secondary | ICD-10-CM

## 2017-03-13 ENCOUNTER — Encounter (HOSPITAL_COMMUNITY): Payer: Self-pay | Admitting: Emergency Medicine

## 2017-03-13 ENCOUNTER — Ambulatory Visit (HOSPITAL_COMMUNITY)
Admission: EM | Admit: 2017-03-13 | Discharge: 2017-03-13 | Disposition: A | Discharge status: Home / Self Care | Payer: Self-pay | Attending: Family Medicine | Admitting: Family Medicine

## 2017-03-13 ENCOUNTER — Other Ambulatory Visit: Payer: Self-pay

## 2017-03-13 DIAGNOSIS — M5489 Other dorsalgia: Secondary | ICD-10-CM

## 2017-03-13 DIAGNOSIS — K5909 Other constipation: Secondary | ICD-10-CM

## 2017-03-13 DIAGNOSIS — R109 Unspecified abdominal pain: Secondary | ICD-10-CM

## 2017-03-13 LAB — POCT URINALYSIS DIP (DEVICE)
Bilirubin Urine: NEGATIVE
Glucose, UA: NEGATIVE mg/dL
Ketones, ur: NEGATIVE mg/dL
LEUKOCYTES UA: NEGATIVE
NITRITE: NEGATIVE
PROTEIN: NEGATIVE mg/dL
Specific Gravity, Urine: 1.025 (ref 1.005–1.030)
UROBILINOGEN UA: 0.2 mg/dL (ref 0.0–1.0)
pH: 7 (ref 5.0–8.0)

## 2017-03-13 MED ORDER — KETOROLAC TROMETHAMINE 30 MG/ML IJ SOLN
INTRAMUSCULAR | Status: AC
  Filled 2017-03-13: qty 1

## 2017-03-13 MED ORDER — KETOROLAC TROMETHAMINE 30 MG/ML IJ SOLN
30.0000 mg | Freq: Once | INTRAMUSCULAR | Status: AC
Start: 2017-03-13 — End: 2017-03-13
  Administered 2017-03-13: 30 mg via INTRAVENOUS

## 2017-03-13 MED ORDER — MAGNESIUM CITRATE PO SOLN: 1.0000 | Freq: Once | ORAL | 0 refills | Status: AC

## 2017-03-13 MED ORDER — POLYETHYLENE GLYCOL 3350 17 GM/SCOOP PO POWD: 1.0000 | Freq: Once | ORAL | 0 refills | Status: AC

## 2017-03-13 NOTE — Discharge Instructions (Signed)
Follow up for possible kidney stones. If pain persists patient should be seen in ED.

## 2017-03-13 NOTE — ED Triage Notes (Signed)
Pt c/o back pain, R flank pain, also states he feels constipated and hes been straining without having a BM

## 2017-03-13 NOTE — ED Notes (Signed)
Toradol was given im in right buttocks.

## 2017-03-13 NOTE — ED Provider Notes (Signed)
[facility]    CSN: [id number] Arrival date & time: 03/13/17  1910     History   Chief Complaint Chief Complaint  Patient presents with  . Flank Pain  . Back Pain  . Constipation    HPI CHASIN FINDLING is a 47 y.o. male.   47 y.o. male presents with constipation  And right "kidney pain" since this afternoon. Patient denies any fevers or pain with uriantion but states that he has to strain and feel his bowel movements are incomplete.  Condition is acute in nature. Condition is made better by nothing. Condition is made by nothing.. Patient denies any treament prior to there arrival at this facility. Patient states that he has a family history of kidney stones but has not personally had one. Patient denies nausea vomiting diarrhea or fevers.         Past Medical History:  Diagnosis Date  . Anxiety   . Chest pain, non-cardiac   . COPD (chronic obstructive pulmonary disease) (HCC)   . Depression    [facility] Ellis Savage, NP  . Elevated glucose   . GERD (gastroesophageal reflux disease)   . LBP (low back pain)   . Weight gain     Patient Active Problem List   Diagnosis Date Noted  . Elbow pain 09/19/2011  . Erectile dysfunction 01/01/2011  . Lateral epicondylitis  of elbow 12/31/2010  . DEPRESSION 11/07/2009  . OBESITY 09/26/2008  . Chest pain, unspecified 09/26/2008  . LOW BACK PAIN 05/24/2008  . FOOT PAIN 07/20/2007  . KNEE PAIN 05/20/2007  . ABNORMAL GLUCOSE NEC 05/20/2007  . ANXIETY 02/07/2007  . TOBACCO USE DISORDER/SMOKER-SMOKING CESSATION DISCUSSED 02/07/2007  . COPD 02/07/2007  . WEIGHT GAIN 02/07/2007  . GERD 10/02/2006    History reviewed. No pertinent surgical history.     Home Medications    Prior to Admission medications   Medication Sig Start Date End Date Taking? Authorizing Provider  albuterol (PROAIR HFA) 108 (90 BASE) MCG/ACT inhaler Inhale 2 puffs into the lungs every 6 (six) hours as needed for wheezing. 10/15/11 10/14/12   Plotnikov, Georgina Quint, MD  ciprofloxacin (CIPRO) 500 MG tablet Take 1 tablet (500 mg total) by mouth 2 (two) times daily. 05/08/12   Jonita Albee, MD  FLUoxetine (PROZAC) 20 MG capsule Take 1 capsule (20 mg total) by mouth daily. 05/19/11 05/18/12  Plotnikov, Georgina Quint, MD  phentermine 37.5 MG capsule Take 1 capsule (37.5 mg total) by mouth every morning. 10/15/11 07/26/12  Plotnikov, Georgina Quint, MD  predniSONE (DELTASONE) 10 MG tablet Take 1 tablet (10 mg total) by mouth daily. 3 tabs by mouth per day for 3 days,2tabs per day for 3 days,1tab per day for 3 days 10/30/11   Corwin Levins, MD  tadalafil (CIALIS) 5 MG tablet Take 5 mg by mouth daily as needed. 04/14/11   Plotnikov, Georgina Quint, MD  traMADol (ULTRAM) 50 MG tablet Take 2 tablets (100 mg total) by mouth every 6 (six) hours as needed for pain. 02/24/12 03/05/12  Corwin Levins, MD    Family History Family History  Problem Relation Age of Onset  . Depression Mother   . Diabetes Father   . Heart disease Father   . Hyperlipidemia Father   . Hypertension Father   . Anxiety disorder Other   . Diabetes Other   . Coronary artery disease Other     Social History Social History   Tobacco Use  . Smoking status: Current Every  Day Smoker    Packs/day: 1.00  Substance Use Topics  . Alcohol use: No  . Drug use: No     Allergies   Hydrocodone-acetaminophen; Naproxen; and Propoxyphene n-acetaminophen   Review of Systems Review of Systems  Constitutional: Negative for chills and fever.  HENT: Negative for ear pain and sore throat.   Eyes: Negative for pain and visual disturbance.  Respiratory: Negative for cough and shortness of breath.   Cardiovascular: Negative for chest pain and palpitations.  Gastrointestinal: Positive for constipation. Negative for abdominal pain and vomiting.  Genitourinary: Positive for flank pain ( left flank worse with palpation ). Negative for dysuria and hematuria.  Musculoskeletal: Negative for arthralgias and  back pain.  Skin: Negative for color change and rash.  Neurological: Negative for seizures and syncope.  All other systems reviewed and are negative.    Physical Exam Triage Vital Signs ED Triage Vitals [03/13/17 1933]  Enc Vitals Group     BP (!) 154/109     Pulse Rate 84     Resp 16     Temp 97.9 F (36.6 C)     Temp src      SpO2 100 %     Weight      Height      Head Circumference      Peak Flow      Pain Score 10     Pain Loc      Pain Edu?      Excl. in GC?    No data found.  Updated Vital Signs BP (!) 154/109   Pulse 84   Temp 97.9 F (36.6 C)   Resp 16   SpO2 100%      Physical Exam  Constitutional: He appears well-developed and well-nourished.  HENT:  Head: Normocephalic and atraumatic.  Eyes: Conjunctivae are normal.  Neck: Neck supple.  Cardiovascular: Normal rate and regular rhythm.  No murmur heard. Pulmonary/Chest: Effort normal and breath sounds normal. No respiratory distress.  Abdominal: Soft. There is no tenderness.  Flank pain worse with palpation to left kidney  Musculoskeletal: He exhibits no edema.  Neurological: He is alert.  Skin: Skin is warm and dry.  Psychiatric: He has a normal mood and affect.  Nursing note and vitals reviewed.    UC Treatments / Results  Labs (all labs ordered are listed, but only abnormal results are displayed) Labs Reviewed  POCT URINALYSIS DIP (DEVICE) - Abnormal; Notable for the following components:      Result Value   Hgb urine dipstick MODERATE (*)    All other components within normal limits    EKG  EKG Interpretation None       Radiology No results found.  Procedures Procedures (including critical care time)  Medications Ordered in UC Medications - No data to display   Initial Impression / Assessment and Plan / UC Course  I have reviewed the triage vital signs and the nursing notes.  Pertinent labs & imaging results that were available during my care of the patient were  reviewed by me and considered in my medical decision making (see chart for details).      Final Clinical Impressions(s) / UC Diagnoses   Final diagnoses:  None    ED Discharge Orders    None       Controlled Substance Prescriptions [place] Controlled Substance Registry consulted? Not Applicable   Alene MiresOmohundro, Treyshawn Muldrew C, NP 03/13/17 2015

## 2018-07-10 ENCOUNTER — Other Ambulatory Visit: Payer: Self-pay

## 2018-07-10 ENCOUNTER — Encounter (HOSPITAL_COMMUNITY): Payer: Self-pay

## 2018-07-10 ENCOUNTER — Emergency Department (HOSPITAL_COMMUNITY)
Admission: EM | Admit: 2018-07-10 | Discharge: 2018-07-11 | Disposition: A | Discharge status: Home / Self Care | Payer: Self-pay | Attending: Emergency Medicine | Admitting: Emergency Medicine

## 2018-07-10 DIAGNOSIS — R197 Diarrhea, unspecified: Secondary | ICD-10-CM | POA: Insufficient documentation

## 2018-07-10 DIAGNOSIS — J449 Chronic obstructive pulmonary disease, unspecified: Secondary | ICD-10-CM | POA: Insufficient documentation

## 2018-07-10 DIAGNOSIS — R109 Unspecified abdominal pain: Secondary | ICD-10-CM | POA: Insufficient documentation

## 2018-07-10 DIAGNOSIS — Z79899 Other long term (current) drug therapy: Secondary | ICD-10-CM | POA: Insufficient documentation

## 2018-07-10 DIAGNOSIS — F17210 Nicotine dependence, cigarettes, uncomplicated: Secondary | ICD-10-CM | POA: Insufficient documentation

## 2018-07-10 LAB — COMPREHENSIVE METABOLIC PANEL
ALT: 16 U/L (ref 0–44)
AST: 18 U/L (ref 15–41)
Albumin: 3.9 g/dL (ref 3.5–5.0)
Alkaline Phosphatase: 74 U/L (ref 38–126)
Anion gap: 12 (ref 5–15)
BUN: 14 mg/dL (ref 6–20)
CO2: 19 mmol/L — ABNORMAL LOW (ref 22–32)
Calcium: 8.9 mg/dL (ref 8.9–10.3)
Chloride: 105 mmol/L (ref 98–111)
Creatinine, Ser: 1 mg/dL (ref 0.61–1.24)
GFR calc Af Amer: >60 mL/min (ref >60)
GFR calc non Af Amer: >60 mL/min (ref >60)
Glucose, Bld: 167 mg/dL — ABNORMAL HIGH (ref 70–99)
Potassium: 3.4 mmol/L — ABNORMAL LOW (ref 3.5–5.1)
Sodium: 136 mmol/L (ref 135–145)
Total Bilirubin: 0.5 mg/dL (ref 0.3–1.2)
Total Protein: 6.9 g/dL (ref 6.5–8.1)

## 2018-07-10 LAB — CBC
HCT: 43.3 % (ref 39.0–52.0)
Hemoglobin: 14.4 g/dL (ref 13.0–17.0)
MCH: 29 pg (ref 26.0–34.0)
MCHC: 33.3 g/dL (ref 30.0–36.0)
MCV: 87.3 fL (ref 80.0–100.0)
Platelets: 73 10*3/uL — ABNORMAL LOW (ref 150–400)
RBC: 4.96 MIL/uL (ref 4.22–5.81)
RDW: 13.1 % (ref 11.5–15.5)
WBC: 10.3 10*3/uL (ref 4.0–10.5)
nRBC: 0 % (ref 0.0–0.2)

## 2018-07-10 LAB — LIPASE, BLOOD: Lipase: 26 U/L (ref 11–51)

## 2018-07-10 MED ORDER — SODIUM CHLORIDE 0.9% FLUSH
3.0000 mL | Freq: Once | INTRAVENOUS | Status: AC
  Administered 2018-07-11: 3 mL via INTRAVENOUS

## 2018-07-10 NOTE — ED Triage Notes (Signed)
Onset 1 hour PTA upper abd pain and nausea.  Pt siting in wheelchair moaning.  States 30 min after eating dinner pain started.

## 2018-07-11 ENCOUNTER — Emergency Department (HOSPITAL_COMMUNITY): Payer: Self-pay

## 2018-07-11 LAB — TROPONIN I: Troponin I: <0.03 ng/mL (ref <0.03)

## 2018-07-11 LAB — RAPID URINE DRUG SCREEN, HOSP PERFORMED
Amphetamines: NOT DETECTED
Barbiturates: NOT DETECTED
Benzodiazepines: NOT DETECTED
Cocaine: POSITIVE — AB
Opiates: NOT DETECTED
Tetrahydrocannabinol: POSITIVE — AB

## 2018-07-11 LAB — URINALYSIS, ROUTINE W REFLEX MICROSCOPIC
Bilirubin Urine: NEGATIVE
Glucose, UA: 150 mg/dL — AB
Hgb urine dipstick: NEGATIVE
Ketones, ur: 5 mg/dL — AB
Leukocytes,Ua: NEGATIVE
Nitrite: NEGATIVE
Protein, ur: NEGATIVE mg/dL
Specific Gravity, Urine: 1.025 (ref 1.005–1.030)
pH: 6 (ref 5.0–8.0)

## 2018-07-11 MED ORDER — SODIUM CHLORIDE 0.9 % IV BOLUS
1000.0000 mL | Freq: Once | INTRAVENOUS | Status: AC
  Administered 2018-07-11: 1000 mL via INTRAVENOUS

## 2018-07-11 MED ORDER — DICYCLOMINE HCL 10 MG/ML IM SOLN
20.0000 mg | Freq: Once | INTRAMUSCULAR | Status: AC
  Administered 2018-07-11: 20 mg via INTRAMUSCULAR
  Filled 2018-07-11: qty 2

## 2018-07-11 MED ORDER — ONDANSETRON 4 MG PO TBDP: 4.0000 mg | ORAL_TABLET | Freq: Three times a day (TID) | ORAL | 0 refills | Status: DC | PRN

## 2018-07-11 MED ORDER — DICYCLOMINE HCL 20 MG PO TABS: 20.0000 mg | ORAL_TABLET | Freq: Two times a day (BID) | ORAL | 0 refills | Status: DC

## 2018-07-11 NOTE — ED Notes (Signed)
Patient transported to X-ray 

## 2018-07-11 NOTE — Discharge Instructions (Signed)
Thank you for allowing me to care for you today in the Emergency Department.   Take 1 tablet of Bentyl by mouth 2 times daily.  Let 1 tablet of Zofran dissolve in your tongue every 8 hours as needed for pain.  Make sure you drink plenty fluids to avoid dehydration.  Call to schedule a follow-up appointment with your primary care provider.  Abdominal (belly) pain can be caused by many things. Your caregiver performed an examination and possibly ordered blood/urine tests and imaging (CT scan, x-rays, ultrasound). Many cases can be observed and treated at home after initial evaluation in the emergency department. Even though you are being discharged home, abdominal pain can be unpredictable. Therefore, you need a repeated exam if your pain does not resolve, returns, or worsens. Most patients with abdominal pain don't have to be admitted to the hospital or have surgery, but serious problems like appendicitis and gallbladder attacks can start out as nonspecific pain. Many abdominal conditions cannot be diagnosed in one visit, so follow-up evaluations are very important.  SEEK IMMEDIATE MEDICAL ATTENTION IF: The pain does not go away or becomes severe.  A temperature above 101 develops.  Repeated vomiting occurs (multiple episodes).  The pain becomes localized to portions of the abdomen. The right side could possibly be appendicitis. In an adult, the left lower portion of the abdomen could be colitis or diverticulitis.  Blood is being passed in stools or vomit (bright red or black tarry stools).  Return also if you develop chest pain, difficulty breathing, dizziness or fainting, or become confused, poorly responsive, or inconsolable (young children).

## 2018-07-11 NOTE — ED Provider Notes (Signed)
[facility] EMERGENCY DEPARTMENT Provider Note   CSN: [id number] Arrival date & time: 07/10/18  2216    History   Chief Complaint Chief Complaint  Patient presents with  . Abdominal Pain    HPI Shane Rich is a 48 y.o. male with a history of opioid abuse on Suboxone, COPD, depression, GERD, and anxiety who presents to the emergency department with a chief complaint of abdominal pain.  The patient reports a sudden onset sharp lower abdominal pain that began several hours prior to arrival.  He reports the pain was worse with laying flat.  He attempted to treat his symptoms with clonidine as he thought he was having a panic attack, but reports no improvement in his symptoms.  He has no other known aggravating or alleviating factors.  Pain has gradually improved since onset and is currently at 3 out of 10.  He reports the pain began approximately an hour after eating dinner.  He is unsure of which started first, the pain or diarrhea.  He reports that he had 3 episodes of diarrhea.  States that he was in the bathroom almost an hour for each of the episodes.  Reports he was sitting in the dark so he is unsure if the stool was bloody or black.  He had associated nausea.  He denies fever, chills, back pain, vomiting, dysuria, hematuria, constipation, penile or testicular pain or discharge.  He is having no chest pain, dizziness, lightheadedness, visual changes, testicular or penile pain or swelling, or back pain.  He reports a history of opioid abuse.  Reports that he used to cut up fentanyl patches in the small strips.  He is currently on Suboxone.  Reports he occasionally has a few "slip backs", but denies opioid use today.  He reports he smokes approximately 1 pack of cigarettes a day.  He reports that he also uses cocaine, last use was this morning.  He denies any other IV or recreational drug use.  Denies alcohol use.  No history of abdominal surgery.  No recent camping.  No  suspicious food intake.  No known sick contacts.  The history is provided by the patient. No language interpreter was used.    Past Medical History:  Diagnosis Date  . Anxiety   . Chest pain, non-cardiac   . COPD (chronic obstructive pulmonary disease) (HCC)   . Depression    [facility] Ellis Savage, NP  . Elevated glucose   . GERD (gastroesophageal reflux disease)   . LBP (low back pain)   . Weight gain     Patient Active Problem List   Diagnosis Date Noted  . Elbow pain 09/19/2011  . Erectile dysfunction 01/01/2011  . Lateral epicondylitis  of elbow 12/31/2010  . DEPRESSION 11/07/2009  . OBESITY 09/26/2008  . Chest pain, unspecified 09/26/2008  . LOW BACK PAIN 05/24/2008  . FOOT PAIN 07/20/2007  . KNEE PAIN 05/20/2007  . ABNORMAL GLUCOSE NEC 05/20/2007  . ANXIETY 02/07/2007  . TOBACCO USE DISORDER/SMOKER-SMOKING CESSATION DISCUSSED 02/07/2007  . COPD 02/07/2007  . WEIGHT GAIN 02/07/2007  . GERD 10/02/2006    History reviewed. No pertinent surgical history.      Home Medications    Prior to Admission medications   Medication Sig Start Date End Date Taking? Authorizing Provider  albuterol (PROAIR HFA) 108 (90 BASE) MCG/ACT inhaler Inhale 2 puffs into the lungs every 6 (six) hours as needed for wheezing. 10/15/11 10/14/12  Plotnikov, Georgina Quint, MD  ciprofloxacin (CIPRO)  500 MG tablet Take 1 tablet (500 mg total) by mouth 2 (two) times daily. 05/08/12   Jonita AlbeeGuest, Chris W, MD  FLUoxetine (PROZAC) 20 MG capsule Take 1 capsule (20 mg total) by mouth daily. 05/19/11 05/18/12  Plotnikov, Georgina QuintAleksei V, MD  phentermine 37.5 MG capsule Take 1 capsule (37.5 mg total) by mouth every morning. 10/15/11 07/26/12  Plotnikov, Georgina QuintAleksei V, MD  predniSONE (DELTASONE) 10 MG tablet Take 1 tablet (10 mg total) by mouth daily. 3 tabs by mouth per day for 3 days,2tabs per day for 3 days,1tab per day for 3 days 10/30/11   Corwin LevinsJohn, James W, MD  tadalafil (CIALIS) 5 MG tablet Take 5 mg by mouth daily as  needed. 04/14/11   Plotnikov, Georgina QuintAleksei V, MD  traMADol (ULTRAM) 50 MG tablet Take 2 tablets (100 mg total) by mouth every 6 (six) hours as needed for pain. 02/24/12 03/05/12  Corwin LevinsJohn, James W, MD    Family History Family History  Problem Relation Age of Onset  . Depression Mother   . Diabetes Father   . Heart disease Father   . Hyperlipidemia Father   . Hypertension Father   . Anxiety disorder Other   . Diabetes Other   . Coronary artery disease Other     Social History Social History   Tobacco Use  . Smoking status: Current Every Day Smoker    Packs/day: 1.00  Substance Use Topics  . Alcohol use: No  . Drug use: No     Allergies   Hydrocodone-acetaminophen; Naproxen; and Propoxyphene n-acetaminophen   Review of Systems Review of Systems  Constitutional: Negative for appetite change and fever.  HENT: Negative for congestion, sore throat and tinnitus.   Respiratory: Positive for shortness of breath (resolved).   Cardiovascular: Negative for chest pain, palpitations and leg swelling.  Gastrointestinal: Positive for abdominal pain, diarrhea and nausea. Negative for anal bleeding, blood in stool, constipation and vomiting.  Genitourinary: Negative for dysuria, flank pain, frequency, penile pain, penile swelling and urgency.  Musculoskeletal: Negative for back pain.  Skin: Negative for rash.  Allergic/Immunologic: Negative for immunocompromised state.  Neurological: Negative for dizziness, weakness, numbness and headaches.  Psychiatric/Behavioral: Negative for confusion.   Physical Exam Updated Vital Signs BP 134/82 (BP Location: Right Arm)   Pulse (!) 56   Temp 98 F (36.7 C) (Oral)   Resp 18   SpO2 98%   Physical Exam Vitals signs and nursing note reviewed.  Constitutional:      General: He is not in acute distress.    Appearance: He is well-developed. He is not ill-appearing, toxic-appearing or diaphoretic.  HENT:     Head: Normocephalic.     Mouth/Throat:      Mouth: Mucous membranes are moist.  Eyes:     Conjunctiva/sclera: Conjunctivae normal.  Neck:     Musculoskeletal: Neck supple.  Cardiovascular:     Rate and Rhythm: Regular rhythm. Bradycardia present.     Pulses: Normal pulses.     Heart sounds: No murmur. No friction rub. No gallop.   Pulmonary:     Effort: Pulmonary effort is normal. No respiratory distress.     Breath sounds: Normal breath sounds. No stridor. No wheezing, rhonchi or rales.  Chest:     Chest wall: No tenderness.  Abdominal:     General: There is no distension.     Palpations: Abdomen is soft. There is no mass.     Tenderness: There is abdominal tenderness. There is no right CVA tenderness, left  CVA tenderness, guarding or rebound.     Hernia: No hernia is present.     Comments: Hyperactive bowel sounds in all 4 quadrants.  Mild diffuse tenderness to palpation throughout the abdomen.  No rebound or guarding.  Abdomen is soft, nondistended.  No CVA tenderness bilaterally.  Negative Murphy sign.  No tenderness over McBurney's point.  Skin:    General: Skin is warm and dry.     Capillary Refill: Capillary refill takes less than 2 seconds.  Neurological:     Mental Status: He is alert.  Psychiatric:        Behavior: Behavior normal.     ED Treatments / Results  Labs (all labs ordered are listed, but only abnormal results are displayed) Labs Reviewed  COMPREHENSIVE METABOLIC PANEL - Abnormal; Notable for the following components:      Result Value   Potassium 3.4 (*)    CO2 19 (*)    Glucose, Bld 167 (*)    All other components within normal limits  CBC - Abnormal; Notable for the following components:   Platelets 73 (*)    All other components within normal limits  LIPASE, BLOOD  URINALYSIS, ROUTINE W REFLEX MICROSCOPIC  RAPID URINE DRUG SCREEN, HOSP PERFORMED    EKG None  Radiology No results found.  Procedures Procedures (including critical care time)  Medications Ordered in ED Medications   sodium chloride flush (NS) 0.9 % injection 3 mL (has no administration in time range)  dicyclomine (BENTYL) injection 20 mg (has no administration in time range)     Initial Impression / Assessment and Plan / ED Course  I have reviewed the triage vital signs and the nursing notes.  Pertinent labs & imaging results that were available during my care of the patient were reviewed by me and considered in my medical decision making (see chart for details).        48 year old male with a history of opioid abuse on Suboxone, COPD, depression, GERD, and anxiety senting with crampy abdominal pain and diarrhea, onset tonight.  The patient is on Suboxone and reports that he does occasionally have relapses of opioid use (he used fentanyl patches), but no recent use.  He did use cocaine within the last 24 hours, but is having no chest pain.  On arrival, the patient is bradycardic.  EKG was sinus bradycardia.  This is similar to previous EKG.  He is asymptomatic.  Labs are notable for thrombocytopenia of 73.  UDS positive for cocaine and THC.  UA with mild ketonuria and glucosuria, but otherwise unremarkable for infection.  Bicarb is slightly decreased at 19, which could be secondary to diarrhea.  Glucose is 167, but anion gap is normal.  Abdominal x-ray negative for dilated bowel loops or free intraperitoneal air.  He was given IV fluids for mild dehydration and Bentyl for pain control.  On reevaluation, he reports pain has resolved.  At this time, I am uncertain of the etiology of the patient's symptoms, but given that clinically he has significantly improved, have a low suspicion for infectious colitis, diverticulitis, appendicitis, UTI, pancreatitis, cholecystitis, SBO, or testicular torsion.  He was successfully fluid challenge in the ER without vomiting.  He has had no change in his hemodynamic stability since arrival in the ER.  Will discharge home with Bentyl and Zofran.  Recommended outpatient  follow-up.  Safe for discharge at this time.   Final Clinical Impressions(s) / ED Diagnoses   Final diagnoses:  None  ED Discharge Orders    None       Barkley Boards, PA-C 07/11/18 3491    Marily Memos, MD 07/11/18 [phone]

## 2018-07-11 NOTE — ED Notes (Signed)
Pt ingested fluids without complaint. No N/V/D.

## 2019-09-25 ENCOUNTER — Telehealth: Payer: Self-pay | Admitting: Internal Medicine

## 2019-10-27 ENCOUNTER — Encounter: Payer: Self-pay | Admitting: Internal Medicine

## 2019-10-27 ENCOUNTER — Telehealth (INDEPENDENT_AMBULATORY_CARE_PROVIDER_SITE_OTHER): Payer: BC Managed Care – PPO | Admitting: Internal Medicine

## 2019-10-27 DIAGNOSIS — L989 Disorder of the skin and subcutaneous tissue, unspecified: Secondary | ICD-10-CM

## 2019-10-27 DIAGNOSIS — F17200 Nicotine dependence, unspecified, uncomplicated: Secondary | ICD-10-CM | POA: Diagnosis not present

## 2019-10-27 DIAGNOSIS — J441 Chronic obstructive pulmonary disease with (acute) exacerbation: Secondary | ICD-10-CM

## 2019-10-27 DIAGNOSIS — Z7689 Persons encountering health services in other specified circumstances: Secondary | ICD-10-CM

## 2019-10-27 MED ORDER — ALBUTEROL SULFATE HFA 108 (90 BASE) MCG/ACT IN AERS: 2.0000 | INHALATION_SPRAY | Freq: Four times a day (QID) | RESPIRATORY_TRACT | 2 refills | Status: AC | PRN

## 2019-10-27 NOTE — Progress Notes (Signed)
Virtual Visit via Telephone Note  I connected with Shane Rich, on 10/27/2019 at 10:17 AM by telephone due to the COVID-19 pandemic and verified that I am speaking with the correct person using two identifiers.   Consent: I discussed the limitations, risks, security and privacy concerns of performing an evaluation and management service by telephone and the availability of in person appointments. I also discussed with the patient that there may be a patient responsible charge related to this service. The patient expressed understanding and agreed to proceed.   Location of Patient: Home   Location of Provider: Clinic    Persons participating in Telemedicine visit: Shane Rich [facility] Dr. Earlene Plater      History of Present Illness: Patient has a visit to establish care. Was previously seeing [facility] for primary care services. Has a history fo depression and anxiety. Currently taking Adderall, Prozac, and Seroquel prescribed by psych.    Has concerns for skin lesion on back of his neck. Has been present for about one year. Thinks it started from a bug bite. Doesn't particularly bother him. About the size of a quarter. Wants it evaluated in person.    Past Medical History:  Diagnosis Date  . Anxiety   . Chest pain, non-cardiac   . COPD (chronic obstructive pulmonary disease) (HCC)   . Depression    [facility] Ellis Savage, NP  . Elevated glucose   . GERD (gastroesophageal reflux disease)   . LBP (low back pain)   . Weight gain    Allergies  Allergen Reactions  . Hydrocodone-Acetaminophen     REACTION: nausea on empty stomach  . Naproxen     REACTION: chest hurts  . Propoxyphene N-Acetaminophen     REACTION: nausea    Current Outpatient Medications on File Prior to Visit  Medication Sig Dispense Refill  . ADDERALL XR 30 MG 24 hr capsule Take 30 mg by mouth at bedtime.    Marland Kitchen FLUoxetine (PROZAC) 20 MG capsule Take 20 mg by mouth daily.    .  QUEtiapine (SEROQUEL) 100 MG tablet Take 1 tablet by mouth 3 (three) times daily.     No current facility-administered medications on file prior to visit.    Observations/Objective: NAD. Speaking clearly.  Work of breathing normal.  Alert and oriented. Mood appropriate.   Assessment and Plan: 1. Encounter to establish care Reviewed patient's PMH, social history, surgical history, and medications.  Is overdue for annual exam, screening blood work, and health maintenance topics. Have asked patient to return for visit to address these items.   2. Skin lesion of neck Follow up at annual exam.   3. COPD (HCC) - albuterol (VENTOLIN HFA) 108 (90 Base) MCG/ACT inhaler; Inhale 2 puffs into the lungs every 6 (six) hours as needed for wheezing or shortness of breath.  Dispense: 8 g; Refill: 2  4. Tobacco use disorder Discussed benefits of quitting smoking and potential therapies to aid in quitting. He would like to consider further.    Follow Up Instructions: Annual exam    I discussed the assessment and treatment plan with the patient. The patient was provided an opportunity to ask questions and all were answered. The patient agreed with the plan and demonstrated an understanding of the instructions.   The patient was advised to call back or seek an in-person evaluation if the symptoms worsen or if the condition fails to improve as anticipated.     I provided 18 minutes total of non-face-to-face  time during this encounter including median intraservice time, reviewing previous notes, investigations, ordering medications, medical decision making, coordinating care and patient verbalized understanding at the end of the visit.    Marcy Siren, D.O. Primary Care at [facility]  10/27/2019, 10:17 AM

## 2019-11-02 ENCOUNTER — Other Ambulatory Visit: Payer: Self-pay

## 2019-11-02 DIAGNOSIS — M25469 Effusion, unspecified knee: Secondary | ICD-10-CM

## 2019-11-02 DIAGNOSIS — M25569 Pain in unspecified knee: Secondary | ICD-10-CM

## 2019-11-23 ENCOUNTER — Other Ambulatory Visit: Payer: Self-pay

## 2019-11-23 ENCOUNTER — Encounter: Payer: BC Managed Care – PPO | Admitting: Internal Medicine

## 2019-11-24 ENCOUNTER — Telehealth (INDEPENDENT_AMBULATORY_CARE_PROVIDER_SITE_OTHER): Payer: BC Managed Care – PPO | Admitting: Internal Medicine

## 2019-11-24 ENCOUNTER — Encounter: Payer: Self-pay | Admitting: Internal Medicine

## 2019-11-24 DIAGNOSIS — L989 Disorder of the skin and subcutaneous tissue, unspecified: Secondary | ICD-10-CM

## 2019-11-24 DIAGNOSIS — R319 Hematuria, unspecified: Secondary | ICD-10-CM | POA: Diagnosis not present

## 2019-11-24 DIAGNOSIS — Z87442 Personal history of urinary calculi: Secondary | ICD-10-CM

## 2019-11-24 NOTE — Progress Notes (Signed)
Virtual Visit via Telephone Note  I connected with Shane Rich, on 11/24/2019 at 8:39 AM by telephone due to the COVID-19 pandemic and verified that I am speaking with the correct person using two identifiers.   Consent: I discussed the limitations, risks, security and privacy concerns of performing an evaluation and management service by telephone and the availability of in person appointments. I also discussed with the patient that there may be a patient responsible charge related to this service. The patient expressed understanding and agreed to proceed.   Location of Patient: Home   Location of Provider: Home    Persons participating in Telemedicine visit: Shane Rich [facility] Dr. Earlene Plater   History of Present Illness: Patient has a visit to follow up on acute concerns.   Thinks he has kidney stones. When he was in [place], he was told that he did. Thinks he passed a couple kidney stones and will notice pain relief with passage of stones. Having blood in his urine intermittently. Was given Flomax and this has improved his ability to urinate. Was having vomiting but this has resolved---he attributes this to missing several days of Seroquel. Afebrile. Able to tolerate PO intake. Pain management.   Concern about skin lesion on upper left side of back. Has been present for about one year. States that the skin color has changed, area is raised, and has increased in size.    Past Medical History:  Diagnosis Date  . Anxiety   . Chest pain, non-cardiac   . COPD (chronic obstructive pulmonary disease) (HCC)   . Depression    [facility] Ellis Savage, NP  . Elevated glucose   . GERD (gastroesophageal reflux disease)   . LBP (low back pain)   . Weight gain    Allergies  Allergen Reactions  . Hydrocodone-Acetaminophen     REACTION: nausea on empty stomach  . Naproxen     REACTION: chest hurts  . Propoxyphene N-Acetaminophen     REACTION: nausea    Current  Outpatient Medications on File Prior to Visit  Medication Sig Dispense Refill  . ADDERALL XR 30 MG 24 hr capsule Take 30 mg by mouth at bedtime.    Marland Kitchen albuterol (VENTOLIN HFA) 108 (90 Base) MCG/ACT inhaler Inhale 2 puffs into the lungs every 6 (six) hours as needed for wheezing or shortness of breath. 8 g 2  . FLUoxetine (PROZAC) 20 MG capsule Take 20 mg by mouth daily.    . QUEtiapine (SEROQUEL) 100 MG tablet Take 1 tablet by mouth 3 (three) times daily.     No current facility-administered medications on file prior to visit.    Observations/Objective: NAD. Speaking clearly.  Work of breathing normal.  Alert and oriented. Mood appropriate.   Assessment and Plan: 1. Hematuria, unspecified type 2. Personal history of kidney stones Given known history of recurrent stones and symptomatic at present, will refer to urology to establish care. Continue Flomax and hydration. Discussed reasons that would warrant seeking urgent evaluation in the interim.  - Ambulatory referral to Urology  3. Skin lesion Patient requests referral to dermatology. Based on history and lesion present in sun exposed area, likely warrants biopsy.  - Ambulatory referral to Dermatology   Follow Up Instructions: PRN and for routine care    I discussed the assessment and treatment plan with the patient. The patient was provided an opportunity to ask questions and all were answered. The patient agreed with the plan and demonstrated an understanding of the  instructions.   The patient was advised to call back or seek an in-person evaluation if the symptoms worsen or if the condition fails to improve as anticipated.     I provided 20 minutes total of non-face-to-face time during this encounter including median intraservice time, reviewing previous notes, investigations, ordering medications, medical decision making, coordinating care and patient verbalized understanding at the end of the visit.    Marcy Siren,  D.O. [facility]  11/24/2019, 8:39 AM

## 2019-12-01 ENCOUNTER — Ambulatory Visit: Payer: BC Managed Care – PPO | Admitting: Family Medicine

## 2019-12-13 DIAGNOSIS — D485 Neoplasm of uncertain behavior of skin: Secondary | ICD-10-CM | POA: Diagnosis not present

## 2019-12-13 DIAGNOSIS — C4441 Basal cell carcinoma of skin of scalp and neck: Secondary | ICD-10-CM | POA: Diagnosis not present

## 2020-01-08 DIAGNOSIS — C4441 Basal cell carcinoma of skin of scalp and neck: Secondary | ICD-10-CM | POA: Diagnosis not present

## 2020-03-12 ENCOUNTER — Ambulatory Visit: Payer: BC Managed Care – PPO | Admitting: Dermatology

## 2020-04-25 DIAGNOSIS — H9202 Otalgia, left ear: Secondary | ICD-10-CM | POA: Diagnosis not present

## 2023-05-01 ENCOUNTER — Ambulatory Visit
Admission: EM | Admit: 2023-05-01 | Discharge: 2023-05-01 | Disposition: A | Discharge status: Home / Self Care | Payer: Self-pay | Attending: Family Medicine | Admitting: Family Medicine

## 2023-05-01 ENCOUNTER — Other Ambulatory Visit: Payer: Self-pay

## 2023-05-01 DIAGNOSIS — R197 Diarrhea, unspecified: Secondary | ICD-10-CM

## 2023-05-01 DIAGNOSIS — R1110 Vomiting, unspecified: Secondary | ICD-10-CM

## 2023-05-01 LAB — POC COVID19/FLU A&B COMBO
Covid Antigen, POC: NEGATIVE
Influenza A Antigen, POC: NEGATIVE
Influenza B Antigen, POC: NEGATIVE

## 2023-05-01 MED ORDER — ONDANSETRON 4 MG PO TBDP: 4.0000 mg | ORAL_TABLET | Freq: Three times a day (TID) | ORAL | 0 refills | Status: AC | PRN

## 2023-05-01 NOTE — ED Provider Notes (Signed)
 [facility]    CSN: [id number] Arrival date & time: 05/01/23  1441      History   Chief Complaint Chief Complaint  Patient presents with   Back Pain    HPI Shane Rich is a 53 y.o. male.   The history is provided by the patient.  Back Pain Abrupt onset of vomiting and diarrhea today 3-4 episodes each without blood.  He admits generalized bodyaches particularly in his back and fatigue.  His granddaughter had a recent vomiting illness. His headache, nondescript generalized abdominal discomfort.  Denies fever, chills, rhinorrhea, nasal congestion, cough, urinary symptoms, dizziness or lightheadedness.  Has a history of chronic back pain.,  COPD, GERD  Past Medical History:  Diagnosis Date   Anxiety    Chest pain, non-cardiac    COPD (chronic obstructive pulmonary disease) (HCC)    Depression    [facility] Ellis Savage, NP   Elevated glucose    GERD (gastroesophageal reflux disease)    LBP (low back pain)    Weight gain     Patient Active Problem List   Diagnosis Date Noted   DEPRESSION 11/07/2009   OBESITY 09/26/2008   Anxiety state 02/07/2007   TOBACCO USE DISORDER/SMOKER-SMOKING CESSATION DISCUSSED 02/07/2007   Other specified chronic obstructive pulmonary disease (HCC) 02/07/2007   GERD 10/02/2006    History reviewed. No pertinent surgical history.     Home Medications    Prior to Admission medications   Medication Sig Start Date End Date Taking? Authorizing Provider  ADDERALL XR 30 MG 24 hr capsule Take 30 mg by mouth at bedtime. 09/19/19   [provider]  albuterol (VENTOLIN HFA) 108 (90 Base) MCG/ACT inhaler Inhale 2 puffs into the lungs every 6 (six) hours as needed for wheezing or shortness of breath. 10/27/19   Arvilla Market, MD  FLUoxetine (PROZAC) 20 MG capsule Take 20 mg by mouth daily. 08/18/19   [provider]  QUEtiapine (SEROQUEL) 100 MG tablet Take 1 tablet by mouth 3 (three) times daily. 08/18/19    [provider]    Family History Family History  Problem Relation Age of Onset   Depression Mother    Diabetes Father    Heart disease Father    Hyperlipidemia Father    Hypertension Father    Anxiety disorder Other    Diabetes Other    Coronary artery disease Other     Social History Social History   Tobacco Use   Smoking status: Every Day    Current packs/day: 1.00    Types: Cigarettes   Smokeless tobacco: Never  Vaping Use   Vaping status: Never Used  Substance Use Topics   Alcohol use: No   Drug use: No     Allergies   Hydrocodone-acetaminophen, Naproxen, and Propoxyphene n-acetaminophen   Review of Systems Review of Systems  Musculoskeletal:  Positive for back pain.     Physical Exam Triage Vital Signs ED Triage Vitals  Encounter Vitals Group     BP 05/01/23 1456 135/83     Systolic BP Percentile --      Diastolic BP Percentile --      Pulse Rate 05/01/23 1456 93     Resp 05/01/23 1456 20     Temp 05/01/23 1456 97.6 F (36.4 C)     Temp Source 05/01/23 1456 Oral     SpO2 05/01/23 1456 97 %     Weight 05/01/23 1454 185 lb (83.9 kg)     Height  05/01/23 1454 5\' 9"  (1.753 m)     Head Circumference --      Peak Flow --      Pain Score 05/01/23 1454 6     Pain Loc --      Pain Education --      Exclude from Growth Chart --    No data found.  Updated Vital Signs BP 135/83 (BP Location: Right Arm)   Pulse 93   Temp 97.6 F (36.4 C) (Oral)   Resp 20   Ht 5\' 9"  (1.753 m)   Wt 185 lb (83.9 kg)   SpO2 97%   BMI 27.32 kg/m   Visual Acuity Right Eye Distance:   Left Eye Distance:   Bilateral Distance:    Right Eye Near:   Left Eye Near:    Bilateral Near:     Physical Exam Vitals and nursing note reviewed.  Constitutional:      Appearance: He is not ill-appearing.  HENT:     Head: Normocephalic.     Right Ear: Tympanic membrane and ear canal normal.     Left Ear: Tympanic membrane and ear canal normal.     Mouth/Throat:      Mouth: Mucous membranes are moist.  Eyes:     General:        Right eye: No discharge.        Left eye: No discharge.     Conjunctiva/sclera: Conjunctivae normal.  Cardiovascular:     Rate and Rhythm: Normal rate and regular rhythm.     Heart sounds: Normal heart sounds.  Pulmonary:     Effort: Pulmonary effort is normal. No respiratory distress.     Breath sounds: Normal breath sounds. No wheezing or rales.  Abdominal:     General: Bowel sounds are normal.     Palpations: Abdomen is soft.     Tenderness: There is no abdominal tenderness. There is no guarding.  Musculoskeletal:     Cervical back: Neck supple. No rigidity.     Lumbar back: No bony tenderness.  Neurological:     Mental Status: He is alert.      UC Treatments / Results  Labs (all labs ordered are listed, but only abnormal results are displayed) Labs Reviewed  POC COVID19/FLU A&B COMBO    EKG   Radiology No results found.  Procedures Procedures (including critical care time)  Medications Ordered in UC Medications - No data to display  Initial Impression / Assessment and Plan / UC Course  I have reviewed the triage vital signs and the nursing notes.  Pertinent labs & imaging results that were available during my care of the patient were reviewed by me and considered in my medical decision making (see chart for details).     53 year old male with vomiting and diarrhea today recently exposed to someone with similar symptoms.  He is well-appearing, afebrile, abdominal exam is benign.  No evidence of dehydration point-of-care COVID and flu are negative.  Recommend increase fluid intake particularly fluids with electrolytes, will Rx ondansetron for vomiting, once vomiting stops can treat diarrhea with brat diet.  Running signs and follow-up reviewed with patient Final Clinical Impressions(s) / UC Diagnoses   Final diagnoses:  Vomiting and diarrhea   Discharge Instructions   None    ED  Prescriptions   None    PDMP not reviewed this encounter.   Meliton Rattan, [place] 05/01/23 [phone]

## 2023-05-01 NOTE — ED Triage Notes (Addendum)
 Pt presents with complaints of headache, nausea, vomiting, generalized abdominal pain, excruciating back pain, and diarrhea that began this morning. Pt's granddaughter was recently diagnosed with stomach bug. Pt currently rates his pain a 6/10. Zofran given this AM with no relief.
